# Patient Record
Sex: Male | Born: 1980
Health system: Southern US, Community
[De-identification: ages and names within clinical notes are randomized; demographics above are authoritative.]

## PROBLEM LIST (undated history)

## (undated) DIAGNOSIS — N63 Unspecified lump in unspecified breast: Secondary | ICD-10-CM

## (undated) DIAGNOSIS — E7800 Pure hypercholesterolemia, unspecified: Secondary | ICD-10-CM

## (undated) HISTORY — PX: ARTHROSCOPIC REPAIR ACL: SUR80

## (undated) HISTORY — PX: ANKLE FRACTURE SURGERY: SHX122

---

## 2004-03-14 ENCOUNTER — Ambulatory Visit: Payer: Self-pay | Admitting: Orthopaedic Surgery

## 2005-06-27 ENCOUNTER — Ambulatory Visit: Payer: Self-pay | Admitting: Cardiovascular Disease

## 2005-06-27 ENCOUNTER — Ambulatory Visit: Payer: Self-pay | Admitting: Family Medicine

## 2005-07-10 ENCOUNTER — Ambulatory Visit: Payer: Self-pay

## 2005-07-10 ENCOUNTER — Encounter: Payer: Self-pay | Admitting: Cardiology

## 2005-07-31 ENCOUNTER — Ambulatory Visit: Payer: Self-pay | Admitting: Family Medicine

## 2007-04-29 ENCOUNTER — Ambulatory Visit: Payer: Self-pay | Admitting: Gastroenterology

## 2007-05-13 ENCOUNTER — Ambulatory Visit: Payer: Self-pay | Admitting: Gastroenterology

## 2010-08-20 NOTE — Assessment & Plan Note (Signed)
[facility]                         GASTROENTEROLOGY OFFICE NOTE   Luis Sims, Luis Sims                        MRN:          [id number]  DATE:04/29/2007                            DOB:          Dec 04, 1980    REFERRING PHYSICIAN:  Derrek Gu, MD   REASON FOR CONSULT:  Hemorrhoids and rectal bleeding.   HISTORY OF PRESENT ILLNESS:  This is a 30 year old white male who  relates mild constipation and a 24 hour episode of nausea and vomiting  that occured several days ago.  With the constipation he noted a small  amount of bright red blood per rectum and then perianal swelling and  pain.  The bleeding stopped, but the pain and swelling worsened, and he  was seen by Dr. Derrek Gu, January 20.  Three thrombosed external  hemorrhoids were noted.  The largest one was incised.  The patient has  noted some improvement in symptoms but still has significant rectal  pain.  He has used hydrocodone with substantial improvement in pain but  significant drowsiness noted.  The patient has noted small external  hemorrhoids since 2006, but they have not caused any symptoms.  His  nausea, vomiting, and constipation were transient and have fully  resolved.  He has no ongoing gastrointestinal difficulties.  Family  history is negative for colon cancer, colon polyps, and inflammatory  bowel disease.   PAST MEDICAL HISTORY:  1. Hypertension, controlled with diet.  2. Status post left ACL repair by Dr. Rennis Chris January 2008.   CURRENT MEDICATIONS:  1. Hydrocodone 5/500 q.6h. p.r.n.  2. Tucks pads p.r.n.  3. Preparation H p.r.n.   MEDICATION ALLERGIES:  None known.   SOCIAL HISTORY:  He is single and engaged to be married.  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  He drinks a  modest amount of alcohol on social occasions and denies tobacco product  usage.  He has an Set designer and attended college at [employer].  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   REVIEW OF SYSTEMS:  Per the handwritten form.   PHYSICAL EXAM:  Well-developed, well-nourished white male in no acute  distress.  Height 6 feet 3 inches, weight 251.2 pounds, blood pressure 130/76,  pulse 60 and regular.  HEENT:  Anicteric sclerae.  Oropharynx is clear.  CHEST:  Clear to auscultation bilaterally.  CARDIAC:  Regular rate and rhythm without murmurs.  ABDOMEN:  Soft and nontender, nondistended, normoactive bowel sounds, no  palpable organomegaly, masses, or hernias.  RECTAL:  3 thrombosed external hemorrhoids which are all tender.  The  largest hemorrhoid has a healing incision site.  Digital examination  reveals anal canal tenderness, hemoccult positive stool in the vault,  and no other lesions.  NEUROLOGIC:  Alert and oriented x3.  Grossly nonfocal.   ASSESSMENT/PLAN:  Thrombosed external hemorrhoids with small volume  hematochezia.  Begin all standard rectal care instructions, begin Advil  400-600 mg q.8h. x4-5 days and then p.r.n.  Change to Darvocet-N 100, 1-  2 q.6h. p.r.n., begin AnaMantel 2.5% t.i.d.  Schedule flexible  sigmoidoscopy with possible destruction of internal hemorrhoids if  present.     Luis Lick. Russella Dar, MD, [facility]  Electronically Signed    MTS/MedQ  DD: 04/29/2007  DT: 04/29/2007  Job #: [id number]   cc:   Marye Round, MD

## 2011-08-09 ENCOUNTER — Emergency Department (HOSPITAL_COMMUNITY)
Admission: EM | Admit: 2011-08-09 | Discharge: 2011-08-09 | Disposition: A | Discharge status: Home / Self Care | Payer: BC Managed Care – PPO | Attending: Emergency Medicine | Admitting: Emergency Medicine

## 2011-08-09 ENCOUNTER — Encounter (HOSPITAL_COMMUNITY): Payer: Self-pay

## 2011-08-09 ENCOUNTER — Emergency Department (HOSPITAL_COMMUNITY): Payer: BC Managed Care – PPO

## 2011-08-09 DIAGNOSIS — S99919A Unspecified injury of unspecified ankle, initial encounter: Secondary | ICD-10-CM | POA: Insufficient documentation

## 2011-08-09 DIAGNOSIS — M25569 Pain in unspecified knee: Secondary | ICD-10-CM | POA: Insufficient documentation

## 2011-08-09 DIAGNOSIS — X500XXA Overexertion from strenuous movement or load, initial encounter: Secondary | ICD-10-CM | POA: Insufficient documentation

## 2011-08-09 DIAGNOSIS — S8992XA Unspecified injury of left lower leg, initial encounter: Secondary | ICD-10-CM

## 2011-08-09 DIAGNOSIS — S8990XA Unspecified injury of unspecified lower leg, initial encounter: Secondary | ICD-10-CM | POA: Insufficient documentation

## 2011-08-09 MED ORDER — HYDROCODONE-ACETAMINOPHEN 5-325 MG PO TABS: 1.0000 | ORAL_TABLET | Freq: Once | ORAL | Status: DC

## 2011-08-09 MED ORDER — HYDROCODONE-ACETAMINOPHEN 5-325 MG PO TABS: 1.0000 | ORAL_TABLET | ORAL | Status: AC | PRN

## 2011-08-09 MED ORDER — IBUPROFEN 800 MG PO TABS: 800.0000 mg | ORAL_TABLET | Freq: Three times a day (TID) | ORAL | Status: AC

## 2011-08-09 NOTE — Discharge Instructions (Signed)
CALL DR. SUPPLE ON Monday TO SCHEDULE A RECHECK APPOINTMENT FOR NEXT WEEK. ICE AND ELEVATE TO REDUCE SWELLING. WEAR KNEE IMMOBILIZER WHEN AMBULATING AND CRUTCHES TO BE WEIGHT BEARING AS TOLERATED. NORCO FOR PAIN, IBUPROFEN EVERY 8 HOURS FOR INFLAMMATION. RETURN HERE AS NEEDED.  Knee Immobilization You have been prescribed a knee immobilizer. This is used to support and protect an injured or painful knee. Knee immobilizers keep your knee from being used while it is healing. Some of the common immobilizers used include splints (air, plaster, fiberglass or aluminum) or casts. Wear your knee immobilizer as instructed and only remove it as instructed. If the immobilizer is used to protect broken bones, torn ligaments or torn cartilage, it may be 4 to 6 weeks before you are able to begin rehabilitating your knee, and it may be longer than that before you are able to return to athletic activity. HOME CARE INSTRUCTIONS   Use powder to control irritation from sweat and friction.   Adjust the immobilizer to be firm but not tight. Signs of an immobilizer that is too tight include:   Swelling.   Numbness.   Color change in your foot or ankle.   Cryotherapy Cryotherapy means treatment with cold. Ice or gel packs can be used to reduce both pain and swelling. Ice is the most helpful within the first 24 to 48 hours after an injury or flareup from overusing a muscle or joint. Sprains, strains, spasms, burning pain, shooting pain, and aches can all be eased with ice. Ice can also be used when recovering from surgery. Ice is effective, has very few side effects, and is safe for most people to use. PRECAUTIONS  Ice is not a safe treatment option for people with:  Raynaud's phenomenon. This is a condition affecting small blood vessels in the extremities. Exposure to cold may cause your problems to return.   Cold hypersensitivity. There are many forms of cold hypersensitivity, including:   Cold urticaria. Red,  itchy hives appear on the skin when the tissues begin to warm after being iced.   Cold erythema. This is a red, itchy rash caused by exposure to cold.   Cold hemoglobinuria. Red blood cells break down when the tissues begin to warm after being iced. The hemoglobin that carry oxygen are passed into the urine because they cannot combine with blood proteins fast enough.   Numbness or altered sensitivity in the area being iced.  If you have any of the following conditions, do not use ice until you have discussed cryotherapy with your caregiver:  Heart conditions, such as arrhythmia, angina, or chronic heart disease.   High blood pressure.   Healing wounds or open skin in the area being iced.   Current infections.   Rheumatoid arthritis.   Poor circulation.   Diabetes.  Ice slows the blood flow in the region it is applied. This is beneficial when trying to stop inflamed tissues from spreading irritating chemicals to surrounding tissues. However, if you expose your skin to cold temperatures for too long or without the proper protection, you can damage your skin or nerves. Watch for signs of skin damage due to cold. HOME CARE INSTRUCTIONS Follow these tips to use ice and cold packs safely.  Place a dry or damp towel between the ice and skin. A damp towel will cool the skin more quickly, so you may need to shorten the time that the ice is used.   For a more rapid response, add gentle compression to the  ice.   Ice for no more than 10 to 20 minutes at a time. The bonier the area you are icing, the less time it will take to get the benefits of ice.   Check your skin after 5 minutes to make sure there are no signs of a poor response to cold or skin damage.   Rest 20 minutes or more in between uses.   Once your skin is numb, you can end your treatment. You can test numbness by very lightly touching your skin. The touch should be so light that you do not see the skin dimple from the pressure of  your fingertip. When using ice, most people will feel these normal sensations in this order: cold, burning, aching, and numbness.   Do not use ice on someone who cannot communicate their responses to pain, such as small children or people with dementia.  HOW TO MAKE AN ICE PACK Ice packs are the most common way to use ice therapy. Other methods include ice massage, ice baths, and cryo-sprays. Muscle creams that cause a cold, tingly feeling do not offer the same benefits that ice offers and should not be used as a substitute unless recommended by your caregiver. To make an ice pack, do one of the following:  Place crushed ice or a bag of frozen vegetables in a sealable plastic bag. Squeeze out the excess air. Place this bag inside another plastic bag. Slide the bag into a pillowcase or place a damp towel between your skin and the bag.   Mix 3 parts water with 1 part rubbing alcohol. Freeze the mixture in a sealable plastic bag. When you remove the mixture from the freezer, it will be slushy. Squeeze out the excess air. Place this bag inside another plastic bag. Slide the bag into a pillowcase or place a damp towel between your skin and the bag.  SEEK MEDICAL CARE IF:  You develop white spots on your skin. This may give the skin a blotchy (mottled) appearance.   Your skin turns blue or pale.   Your skin becomes waxy or hard.   Your swelling gets worse.  MAKE SURE YOU:   Understand these instructions.   Will watch your condition.   Will get help right away if you are not doing well or get worse.  Document Released: 11/18/2010 Document Revised: 03/13/2011 Document Reviewed: 11/18/2010 [facility] Patient Information 2012 [place], [place].  Crutch Use You have been prescribed crutches to take weight off one of your lower legs or feet (extremities). When using crutches, make sure you are not putting pressure on the armpit (axilla). This could cause damage to the nerves that extend from your  axilla to the hand and arm. When fitted properly the crutches should be 2 to 3 finger widths below the axilla. Your weight should be supported by your hand, and not by resting upon the crutch with the axilla. When walking, first step with the crutches, then swing the healthy leg through and slightly ahead. When going up stairs, first step up with the healthy leg and then follow with the crutches and injured leg up to the same step, and so forth. If there is a handrail, hold both crutches in one hand, place your other hand on the handrail, and while placing your weight on your arms, lift your good leg to the step, then bring the crutches and the injured leg up to that step. Repeat for each step. When going down stairs, first step with the  injured leg and crutches, following down with the healthy leg to the same step. Be very careful, as going down stairs with crutches is very challenging. If you feel wobbly or nervous, sit down and inch yourself down the stairs on your butt. To get up from a chair, hold injured leg forward, grab armrest with one hand and the top of the crutches with the other hand. Using these supports, pull yourself up to a standing position. Reverse this procedure for sitting. See your caregiver for follow up as suggested. If you are discharged in an ace wrap and develop numbness, tingling, swelling, or increased pain, loosen the ace wrap and re-wrap looser. If these problems persist, see your caregiver as needed. If you have been instructed to use partial weight bearing, bear (apply) the amount of weight as suggested by your caregiver. Do not bear weight in an amount that causes pain on the area of injury. Document Released: 03/21/2000 Document Revised: 03/13/2011 Document Reviewed: 05/29/2008 [facility] Patient Information 2012 [place], [place].  Increased pain.   While resting, raise your leg above the level of your heart. This reduces throbbing and helps healing. Prop it up with  pillows.   Remove the immobilizer to bathe and sleep. Wear it other times until you see your doctor again.  When your splint or cast is removed:   Stretching and strengthening are important in caring for and preventing knee injuries. If your knee begins to get sore while you are conditioning, decrease or back off your activities until you no longer have discomfort. Then gradually resume your activities.   When strengthening your knee, increase your activities a little at a time so as not to develop injuries from over use.   Work out an exercise plan with your caregiver or physical therapist to get the best program for you.  SEEK IMMEDIATE MEDICAL CARE IF:   Your knee seems to be getting worse rather than better.   You have increasing pain or swelling in the knee, foot, or ankle.   You have problems caused by the knee immobilizer or it breaks or needs replacement.   You have increased swelling or redness or soreness (inflammation) in your knee.   Your leg becomes warm and more painful.   You develop an unexplained temperature over 102 F (38.9 C).  MAKE SURE YOU:   Understand these instructions.   Will watch your condition.   Will get help right away if you are not doing well or get worse.  Document Released: 03/24/2005 Document Revised: 03/13/2011 Document Reviewed: 09/09/2006 [facility] Patient Information 2012 [place], [place].

## 2011-08-09 NOTE — ED Notes (Signed)
Pt presents with no acuate distress- Pt reports playing ffotball and twisted left knee HX of left knee ACL repairs.  Pt ?out of place.  Pt is able to straight with some degree of pain

## 2011-08-09 NOTE — ED Provider Notes (Signed)
Medical screening examination/treatment/procedure(s) were performed by non-physician practitioner and as supervising physician I was immediately available for consultation/collaboration.  Doug Sou, MD 08/09/11 2324

## 2011-08-09 NOTE — ED Provider Notes (Signed)
History     CSN: [id number]  Arrival date & time 08/09/11  1500   First MD Initiated Contact with Patient 08/09/11 1521      Chief Complaint  Patient presents with  . Knee Injury  . Knee Pain    (Consider location/radiation/quality/duration/timing/severity/associated sxs/prior treatment) Patient is a 31 y.o. male presenting with knee pain. The history is provided by the patient.  Knee Pain This is a new problem. The current episode started today. Pertinent negatives include no numbness. Associated symptoms comments: Playing sports today when he twisted left knee while running, causing a pop. He ambulated off the field and has had progressive pain since that time. He has a history of ruptured ACL in the past x 2. No other complaint..    History reviewed. No pertinent past medical history.  Past Surgical History  Procedure Date  . Arthroscopic repair acl   . Ankle fracture surgery     No family history on file.  History  Substance Use Topics  . Smoking status: Never Smoker   . Smokeless tobacco: Not on file  . Alcohol Use: Yes      Review of Systems  Musculoskeletal: Negative.        See HPI  Skin: Negative.  Negative for wound.  Neurological: Negative.  Negative for numbness.    Allergies  Review of patient's allergies indicates no known allergies.  Home Medications  No current outpatient prescriptions on file.  BP 129/86  Pulse 99  Temp(Src) 98 F (36.7 C) (Oral)  Resp 18  Ht 6\' 3"  (1.905 m)  Wt 247 lb (112.038 kg)  BMI 30.87 kg/m2  SpO2 99%  Physical Exam  Constitutional: He appears well-developed and well-nourished.  Musculoskeletal:       Left knee mildly swollen, no bony deformity. Joint stable. Increased pain medially on MacMurray testing. Distal pulses present.  Skin: Skin is warm and dry. No erythema.    ED Course  Procedures (including critical care time)  Labs Reviewed - No data to display Dg Knee Complete 4 Views Left  08/09/2011   *RADIOLOGY REPORT*  Clinical Data: Pain after injury; history of ACL repair  LEFT KNEE - COMPLETE 4+ VIEW  Comparison: None.  Findings: There are postsurgical changes, consistent with prior ACL repair.  There is mild patellofemoral joint compartment narrowing and small suprapatellar effusion.  There is no evidence of fracture or dislocation, however.  IMPRESSION: There is no evidence of acute osseous injury.  A small suprapatellar effusion is present, however.  If there is clinical concern regarding a ligamentous or meniscal injury, MRI may be of help.  Original Report Authenticated By: Brandon Melnick, M.D.     No diagnosis found.  1. Left knee injury.  MDM  X-ray negative for bony injury, small effusion. Joint stable. He has orthopedic follow up outpatient, a knee immobilizer and crutches. Recommended weight bearing as tolerated.         Rodena Medin, PA-C 08/09/11 1623

## 2014-09-07 ENCOUNTER — Encounter: Payer: Self-pay | Admitting: Gastroenterology

## 2016-05-21 ENCOUNTER — Other Ambulatory Visit: Payer: Self-pay | Admitting: General Surgery

## 2016-05-21 DIAGNOSIS — N63 Unspecified lump in unspecified breast: Secondary | ICD-10-CM

## 2016-05-26 ENCOUNTER — Ambulatory Visit
Admission: RE | Admit: 2016-05-26 | Discharge: 2016-05-26 | Disposition: A | Discharge status: Home / Self Care | Payer: BLUE CROSS/BLUE SHIELD | Source: Ambulatory Visit | Attending: General Surgery | Admitting: General Surgery

## 2016-05-26 DIAGNOSIS — N63 Unspecified lump in unspecified breast: Secondary | ICD-10-CM

## 2016-05-26 HISTORY — DX: Unspecified lump in unspecified breast: N63.0

## 2017-03-20 ENCOUNTER — Telehealth (HOSPITAL_COMMUNITY): Payer: Self-pay | Admitting: Internal Medicine

## 2017-03-23 ENCOUNTER — Other Ambulatory Visit: Payer: Self-pay | Admitting: Internal Medicine

## 2017-03-23 DIAGNOSIS — R0789 Other chest pain: Secondary | ICD-10-CM

## 2017-03-25 NOTE — Telephone Encounter (Signed)
03/20/2017 11:17 AM Phone (Outgoing) Luis Sims, Brandon (Self) [phone] (H)   Left Message - Called pt and lmsg for him to CB to get scheduled for an echo.     By Trina AoGriffin, Sherrol Vicars A    03/23/2017 10:55 AM Phone (Outgoing) Luis Sims, Brandon (Self) [phone] (H)   Left Message - Called pt and lmsg for her to CB to get schedule for an echo.     By Elita BooneGriffin, Jlee Harkless A

## 2017-03-30 ENCOUNTER — Other Ambulatory Visit: Payer: Self-pay

## 2017-03-30 ENCOUNTER — Ambulatory Visit (HOSPITAL_COMMUNITY): Payer: BLUE CROSS/BLUE SHIELD | Attending: Cardiology

## 2017-03-30 ENCOUNTER — Encounter (INDEPENDENT_AMBULATORY_CARE_PROVIDER_SITE_OTHER): Payer: Self-pay

## 2017-03-30 DIAGNOSIS — E785 Hyperlipidemia, unspecified: Secondary | ICD-10-CM | POA: Diagnosis not present

## 2017-03-30 DIAGNOSIS — Z8249 Family history of ischemic heart disease and other diseases of the circulatory system: Secondary | ICD-10-CM | POA: Diagnosis not present

## 2017-03-30 DIAGNOSIS — R0789 Other chest pain: Secondary | ICD-10-CM

## 2017-04-16 ENCOUNTER — Other Ambulatory Visit: Payer: Self-pay

## 2017-04-16 ENCOUNTER — Encounter (HOSPITAL_COMMUNITY): Payer: Self-pay | Admitting: *Deleted

## 2017-04-16 DIAGNOSIS — Z79899 Other long term (current) drug therapy: Secondary | ICD-10-CM | POA: Diagnosis not present

## 2017-04-16 DIAGNOSIS — E7800 Pure hypercholesterolemia, unspecified: Secondary | ICD-10-CM | POA: Diagnosis not present

## 2017-04-16 DIAGNOSIS — K3521 Acute appendicitis with generalized peritonitis, with abscess: Principal | ICD-10-CM | POA: Insufficient documentation

## 2017-04-16 DIAGNOSIS — K3580 Unspecified acute appendicitis: Secondary | ICD-10-CM | POA: Diagnosis present

## 2017-04-16 NOTE — ED Triage Notes (Signed)
Pt c/o umbilical pain radiating into R lower abd since last night.

## 2017-04-17 ENCOUNTER — Observation Stay (HOSPITAL_COMMUNITY): Payer: BLUE CROSS/BLUE SHIELD | Admitting: Certified Registered Nurse Anesthetist

## 2017-04-17 ENCOUNTER — Other Ambulatory Visit: Payer: Self-pay

## 2017-04-17 ENCOUNTER — Encounter (HOSPITAL_COMMUNITY)
Admission: EM | Disposition: A | Discharge status: Home / Self Care | Payer: Self-pay | Source: Home / Self Care | Attending: Emergency Medicine

## 2017-04-17 ENCOUNTER — Emergency Department (HOSPITAL_COMMUNITY): Payer: BLUE CROSS/BLUE SHIELD

## 2017-04-17 ENCOUNTER — Encounter (HOSPITAL_COMMUNITY): Payer: Self-pay | Admitting: Certified Registered Nurse Anesthetist

## 2017-04-17 ENCOUNTER — Observation Stay (HOSPITAL_COMMUNITY)
Admission: EM | Admit: 2017-04-17 | Discharge: 2017-04-17 | Disposition: A | Discharge status: Home / Self Care | Payer: BLUE CROSS/BLUE SHIELD | Attending: General Surgery | Admitting: General Surgery

## 2017-04-17 DIAGNOSIS — K3580 Unspecified acute appendicitis: Secondary | ICD-10-CM | POA: Diagnosis present

## 2017-04-17 HISTORY — DX: Pure hypercholesterolemia, unspecified: E78.00

## 2017-04-17 HISTORY — PX: LAPAROSCOPIC APPENDECTOMY: SHX408

## 2017-04-17 LAB — COMPREHENSIVE METABOLIC PANEL
ALT: 29 U/L (ref 17–63)
AST: 29 U/L (ref 15–41)
Albumin: 4.3 g/dL (ref 3.5–5.0)
Alkaline Phosphatase: 47 U/L (ref 38–126)
Anion gap: 8 (ref 5–15)
BUN: 14 mg/dL (ref 6–20)
CO2: 24 mmol/L (ref 22–32)
Calcium: 9 mg/dL (ref 8.9–10.3)
Chloride: 105 mmol/L (ref 101–111)
Creatinine, Ser: 1.28 mg/dL — ABNORMAL HIGH (ref 0.61–1.24)
GFR calc Af Amer: >60 mL/min (ref >60)
GFR calc non Af Amer: >60 mL/min (ref >60)
Glucose, Bld: 99 mg/dL (ref 65–99)
Potassium: 3.7 mmol/L (ref 3.5–5.1)
Sodium: 137 mmol/L (ref 135–145)
Total Bilirubin: 1.2 mg/dL (ref 0.3–1.2)
Total Protein: 6.9 g/dL (ref 6.5–8.1)

## 2017-04-17 LAB — CBC
HCT: 43.2 % (ref 39.0–52.0)
Hemoglobin: 15.1 g/dL (ref 13.0–17.0)
MCH: 31.9 pg (ref 26.0–34.0)
MCHC: 35 g/dL (ref 30.0–36.0)
MCV: 91.1 fL (ref 78.0–100.0)
Platelets: 164 10*3/uL (ref 150–400)
RBC: 4.74 MIL/uL (ref 4.22–5.81)
RDW: 12.8 % (ref 11.5–15.5)
WBC: 9.5 10*3/uL (ref 4.0–10.5)

## 2017-04-17 LAB — URINALYSIS, ROUTINE W REFLEX MICROSCOPIC
Bacteria, UA: NONE SEEN
Bilirubin Urine: NEGATIVE
Glucose, UA: NEGATIVE mg/dL
Ketones, ur: NEGATIVE mg/dL
Leukocytes, UA: NEGATIVE
Nitrite: NEGATIVE
Protein, ur: NEGATIVE mg/dL
Specific Gravity, Urine: 1.02 (ref 1.005–1.030)
Squamous Epithelial / LPF: NONE SEEN
pH: 5 (ref 5.0–8.0)

## 2017-04-17 LAB — HIV ANTIBODY (ROUTINE TESTING W REFLEX): HIV Screen 4th Generation wRfx: NONREACTIVE

## 2017-04-17 LAB — LIPASE, BLOOD: Lipase: 40 U/L (ref 11–51)

## 2017-04-17 LAB — MRSA PCR SCREENING: MRSA by PCR: NEGATIVE

## 2017-04-17 SURGERY — APPENDECTOMY, LAPAROSCOPIC
Anesthesia: General | Site: Abdomen

## 2017-04-17 MED ORDER — OXYCODONE HCL 5 MG PO TABS: 5.0000 mg | ORAL_TABLET | Freq: Once | ORAL | Status: DC | PRN

## 2017-04-17 MED ORDER — PANTOPRAZOLE SODIUM 40 MG IV SOLR: 40.0000 mg | Freq: Every day | INTRAVENOUS | Status: DC

## 2017-04-17 MED ORDER — ONDANSETRON 4 MG PO TBDP: 4.0000 mg | ORAL_TABLET | Freq: Four times a day (QID) | ORAL | Status: DC | PRN

## 2017-04-17 MED ORDER — LACTATED RINGERS IV SOLN
INTRAVENOUS | Status: DC
  Administered 2017-04-17 (×3): via INTRAVENOUS

## 2017-04-17 MED ORDER — KETOROLAC TROMETHAMINE 30 MG/ML IJ SOLN
INTRAMUSCULAR | Status: DC | PRN
  Administered 2017-04-17: 30 mg via INTRAVENOUS

## 2017-04-17 MED ORDER — DEXTROSE 5 % IV SOLN
2.0000 g | Freq: Once | INTRAVENOUS | Status: AC
  Administered 2017-04-17: 2 g via INTRAVENOUS
  Filled 2017-04-17: qty 2

## 2017-04-17 MED ORDER — PROMETHAZINE HCL 25 MG/ML IJ SOLN
6.2500 mg | INTRAMUSCULAR | Status: DC | PRN
Start: 2017-04-17 — End: 2017-04-17

## 2017-04-17 MED ORDER — LIDOCAINE 2% (20 MG/ML) 5 ML SYRINGE
INTRAMUSCULAR | Status: AC
  Filled 2017-04-17: qty 5

## 2017-04-17 MED ORDER — FENTANYL CITRATE (PF) 250 MCG/5ML IJ SOLN
INTRAMUSCULAR | Status: AC
  Filled 2017-04-17: qty 5

## 2017-04-17 MED ORDER — 0.9 % SODIUM CHLORIDE (POUR BTL) OPTIME
TOPICAL | Status: DC | PRN
  Administered 2017-04-17: 1000 mL

## 2017-04-17 MED ORDER — HYDRALAZINE HCL 20 MG/ML IJ SOLN: 10.0000 mg | INTRAMUSCULAR | Status: DC | PRN

## 2017-04-17 MED ORDER — BUPIVACAINE-EPINEPHRINE 0.5% -1:200000 IJ SOLN
INTRAMUSCULAR | Status: DC | PRN
  Administered 2017-04-17: 9 mL

## 2017-04-17 MED ORDER — ONDANSETRON 4 MG PO TBDP: 4.0000 mg | ORAL_TABLET | Freq: Four times a day (QID) | ORAL | 0 refills | Status: DC | PRN

## 2017-04-17 MED ORDER — KETOROLAC TROMETHAMINE 30 MG/ML IJ SOLN
INTRAMUSCULAR | Status: AC
  Filled 2017-04-17: qty 1

## 2017-04-17 MED ORDER — MIDAZOLAM HCL 2 MG/2ML IJ SOLN
INTRAMUSCULAR | Status: AC
  Filled 2017-04-17: qty 2

## 2017-04-17 MED ORDER — IOPAMIDOL (ISOVUE-300) INJECTION 61%
INTRAVENOUS | Status: AC
  Administered 2017-04-17: 100 mL
  Filled 2017-04-17: qty 100

## 2017-04-17 MED ORDER — METRONIDAZOLE IN NACL 5-0.79 MG/ML-% IV SOLN
500.0000 mg | Freq: Once | INTRAVENOUS | Status: AC
  Administered 2017-04-17: 500 mg via INTRAVENOUS
  Filled 2017-04-17: qty 100

## 2017-04-17 MED ORDER — ACETAMINOPHEN 325 MG PO TABS: 650.0000 mg | ORAL_TABLET | Freq: Four times a day (QID) | ORAL | Status: DC | PRN

## 2017-04-17 MED ORDER — DEXAMETHASONE SODIUM PHOSPHATE 10 MG/ML IJ SOLN
INTRAMUSCULAR | Status: AC
  Filled 2017-04-17: qty 1

## 2017-04-17 MED ORDER — SUGAMMADEX SODIUM 200 MG/2ML IV SOLN
INTRAVENOUS | Status: AC
  Filled 2017-04-17: qty 2

## 2017-04-17 MED ORDER — PROPOFOL 10 MG/ML IV BOLUS
INTRAVENOUS | Status: AC
  Filled 2017-04-17: qty 20

## 2017-04-17 MED ORDER — ONDANSETRON HCL 4 MG/2ML IJ SOLN
INTRAMUSCULAR | Status: DC | PRN
  Administered 2017-04-17: 4 mg via INTRAVENOUS

## 2017-04-17 MED ORDER — ONDANSETRON HCL 4 MG/2ML IJ SOLN
INTRAMUSCULAR | Status: AC
  Filled 2017-04-17: qty 2

## 2017-04-17 MED ORDER — LIDOCAINE 2% (20 MG/ML) 5 ML SYRINGE
INTRAMUSCULAR | Status: DC | PRN
  Administered 2017-04-17: 100 mg via INTRAVENOUS

## 2017-04-17 MED ORDER — DEXAMETHASONE SODIUM PHOSPHATE 10 MG/ML IJ SOLN
INTRAMUSCULAR | Status: DC | PRN
  Administered 2017-04-17: 10 mg via INTRAVENOUS

## 2017-04-17 MED ORDER — ONDANSETRON HCL 4 MG/2ML IJ SOLN
4.0000 mg | Freq: Four times a day (QID) | INTRAMUSCULAR | Status: DC | PRN
  Administered 2017-04-17: 4 mg via INTRAVENOUS
  Filled 2017-04-17: qty 2

## 2017-04-17 MED ORDER — ROCURONIUM BROMIDE 10 MG/ML (PF) SYRINGE
PREFILLED_SYRINGE | INTRAVENOUS | Status: AC
  Filled 2017-04-17: qty 5

## 2017-04-17 MED ORDER — ROCURONIUM BROMIDE 50 MG/5ML IV SOSY
PREFILLED_SYRINGE | INTRAVENOUS | Status: DC | PRN
  Administered 2017-04-17: 50 mg via INTRAVENOUS

## 2017-04-17 MED ORDER — MORPHINE SULFATE (PF) 4 MG/ML IV SOLN
4.0000 mg | INTRAVENOUS | Status: DC | PRN
  Administered 2017-04-17: 4 mg via INTRAVENOUS
  Filled 2017-04-17: qty 1

## 2017-04-17 MED ORDER — FENTANYL CITRATE (PF) 100 MCG/2ML IJ SOLN
INTRAMUSCULAR | Status: DC | PRN
  Administered 2017-04-17 (×2): 50 ug via INTRAVENOUS
  Administered 2017-04-17 (×2): 25 ug via INTRAVENOUS
  Administered 2017-04-17: 100 ug via INTRAVENOUS

## 2017-04-17 MED ORDER — OXYCODONE HCL 5 MG PO TABS: 5.0000 mg | ORAL_TABLET | ORAL | 0 refills | Status: DC | PRN

## 2017-04-17 MED ORDER — PROPOFOL 10 MG/ML IV BOLUS
INTRAVENOUS | Status: DC | PRN
  Administered 2017-04-17: 200 mg via INTRAVENOUS

## 2017-04-17 MED ORDER — DIPHENHYDRAMINE HCL 25 MG PO CAPS: 25.0000 mg | ORAL_CAPSULE | Freq: Four times a day (QID) | ORAL | Status: DC | PRN

## 2017-04-17 MED ORDER — DIPHENHYDRAMINE HCL 50 MG/ML IJ SOLN: 25.0000 mg | Freq: Four times a day (QID) | INTRAMUSCULAR | Status: DC | PRN

## 2017-04-17 MED ORDER — OXYCODONE HCL 5 MG PO TABS
5.0000 mg | ORAL_TABLET | ORAL | Status: DC | PRN
  Administered 2017-04-17: 5 mg via ORAL
  Filled 2017-04-17: qty 1

## 2017-04-17 MED ORDER — OXYCODONE HCL 5 MG/5ML PO SOLN: 5.0000 mg | Freq: Once | ORAL | Status: DC | PRN

## 2017-04-17 MED ORDER — SODIUM CHLORIDE 0.9 % IR SOLN
Status: DC | PRN
  Administered 2017-04-17: 1000 mL

## 2017-04-17 MED ORDER — KCL IN DEXTROSE-NACL 20-5-0.45 MEQ/L-%-% IV SOLN
INTRAVENOUS | Status: DC
  Administered 2017-04-17: 06:00:00 via INTRAVENOUS
  Filled 2017-04-17: qty 1000

## 2017-04-17 MED ORDER — MIDAZOLAM HCL 2 MG/2ML IJ SOLN
INTRAMUSCULAR | Status: DC | PRN
  Administered 2017-04-17: 2 mg via INTRAVENOUS

## 2017-04-17 MED ORDER — HYDROMORPHONE HCL 1 MG/ML IJ SOLN: 0.2500 mg | INTRAMUSCULAR | Status: DC | PRN

## 2017-04-17 MED ORDER — SUGAMMADEX SODIUM 200 MG/2ML IV SOLN
INTRAVENOUS | Status: DC | PRN
  Administered 2017-04-17: 200 mg via INTRAVENOUS

## 2017-04-17 SURGICAL SUPPLY — 42 items
APPLIER CLIP ROT 10 11.4 M/L (STAPLE)
BENZOIN TINCTURE PRP APPL 2/3 (GAUZE/BANDAGES/DRESSINGS) ×2 IMPLANT
BLADE CLIPPER SURG (BLADE) IMPLANT
CANISTER SUCT 3000ML PPV (MISCELLANEOUS) ×2 IMPLANT
CHLORAPREP W/TINT 26ML (MISCELLANEOUS) ×2 IMPLANT
CLIP APPLIE ROT 10 11.4 M/L (STAPLE) IMPLANT
COVER SURGICAL LIGHT HANDLE (MISCELLANEOUS) ×2 IMPLANT
CUTTER FLEX LINEAR 45M (STAPLE) ×2 IMPLANT
DRSG TEGADERM 2-3/8X2-3/4 SM (GAUZE/BANDAGES/DRESSINGS) ×4 IMPLANT
DRSG TEGADERM 4X4.75 (GAUZE/BANDAGES/DRESSINGS) ×2 IMPLANT
ELECT REM PT RETURN 9FT ADLT (ELECTROSURGICAL) ×2
ELECTRODE REM PT RTRN 9FT ADLT (ELECTROSURGICAL) ×1 IMPLANT
ENDOLOOP SUT PDS II  0 18 (SUTURE)
ENDOLOOP SUT PDS II 0 18 (SUTURE) IMPLANT
FILTER SMOKE EVAC LAPAROSHD (FILTER) ×2 IMPLANT
GAUZE SPONGE 2X2 8PLY STRL LF (GAUZE/BANDAGES/DRESSINGS) ×1 IMPLANT
GLOVE BIO SURGEON STRL SZ7 (GLOVE) ×2 IMPLANT
GLOVE BIOGEL PI IND STRL 7.5 (GLOVE) ×1 IMPLANT
GLOVE BIOGEL PI INDICATOR 7.5 (GLOVE) ×1
GOWN STRL REUS W/ TWL LRG LVL3 (GOWN DISPOSABLE) ×3 IMPLANT
GOWN STRL REUS W/TWL LRG LVL3 (GOWN DISPOSABLE) ×3
KIT BASIN OR (CUSTOM PROCEDURE TRAY) ×2 IMPLANT
KIT ROOM TURNOVER OR (KITS) ×2 IMPLANT
NS IRRIG 1000ML POUR BTL (IV SOLUTION) ×2 IMPLANT
PAD ARMBOARD 7.5X6 YLW CONV (MISCELLANEOUS) ×4 IMPLANT
POUCH SPECIMEN RETRIEVAL 10MM (ENDOMECHANICALS) ×2 IMPLANT
RELOAD STAPLE TA45 3.5 REG BLU (ENDOMECHANICALS) ×2 IMPLANT
SCISSORS ENDO CVD 5DCS (MISCELLANEOUS) IMPLANT
SET IRRIG TUBING LAPAROSCOPIC (IRRIGATION / IRRIGATOR) ×2 IMPLANT
SHEARS HARMONIC ACE PLUS 36CM (ENDOMECHANICALS) ×2 IMPLANT
SLEEVE ENDOPATH XCEL 5M (ENDOMECHANICALS) ×2 IMPLANT
SPECIMEN JAR SMALL (MISCELLANEOUS) ×2 IMPLANT
SPONGE GAUZE 2X2 STER 10/PKG (GAUZE/BANDAGES/DRESSINGS) ×1
STRIP CLOSURE SKIN 1/2X4 (GAUZE/BANDAGES/DRESSINGS) ×2 IMPLANT
SUT MNCRL AB 4-0 PS2 18 (SUTURE) ×2 IMPLANT
TOWEL OR 17X24 6PK STRL BLUE (TOWEL DISPOSABLE) ×2 IMPLANT
TOWEL OR 17X26 10 PK STRL BLUE (TOWEL DISPOSABLE) ×2 IMPLANT
TRAY LAPAROSCOPIC MC (CUSTOM PROCEDURE TRAY) ×2 IMPLANT
TROCAR XCEL BLUNT TIP 100MML (ENDOMECHANICALS) ×2 IMPLANT
TROCAR XCEL NON-BLD 5MMX100MML (ENDOMECHANICALS) ×2 IMPLANT
TUBING INSUFFLATION (TUBING) ×2 IMPLANT
WATER STERILE IRR 1000ML POUR (IV SOLUTION) ×2 IMPLANT

## 2017-04-17 NOTE — Discharge Summary (Signed)
[facility] Discharge Summary   Patient ID: Luis BeltonJames Brandon Sims MRN: [id number] DOB/AGE: 37/05/1980 36 y.o.  Admit date: 04/17/2017 Discharge date: 04/17/2017  Admitting Diagnosis: Acute appendicitis  Discharge Diagnosis Patient Active Problem List   Diagnosis Date Noted  . Acute appendicitis 04/17/2017    Consultants None  Imaging: Ct Abdomen Pelvis W Contrast  Result Date: 04/17/2017 CLINICAL DATA:  Right lower quadrant pain EXAM: CT ABDOMEN AND PELVIS WITH CONTRAST TECHNIQUE: Multidetector CT imaging of the abdomen and pelvis was performed using the standard protocol following bolus administration of intravenous contrast. CONTRAST:  100mL ISOVUE-300 IOPAMIDOL (ISOVUE-300) INJECTION 61% COMPARISON:  None. FINDINGS: Lower chest: No pulmonary nodules or pleural effusion. No visible pericardial effusion. Hepatobiliary: Normal hepatic contours and density. No visible biliary dilatation. Normal gallbladder. Pancreas: Normal contours without ductal dilatation. No peripancreatic fluid collection. Spleen: Normal. Adrenals/Urinary Tract: --Adrenal glands: Normal. --Right kidney/ureter: No hydronephrosis or perinephric stranding. No nephrolithiasis. No obstructing ureteral stones. --Left kidney/ureter: No hydronephrosis or perinephric stranding. No nephrolithiasis. No obstructing ureteral stones. --Urinary bladder: Unremarkable. Stomach/Bowel: --Stomach/Duodenum: No hiatal hernia or other gastric abnormality. Normal duodenal course and caliber. --Small bowel: No dilatation or inflammation. --Colon: No focal abnormality. --Appendix: Location: Inferior to the cecum Diameter: 8 mm Appendicolith: No Mucosal hyper-enhancement: Yes Extraluminal gas: No Periappendiceal collection: No defined collection. Small amount of adjacent fluid. Vascular/Lymphatic: Normal course and caliber of the major abdominal vessels. No abdominal or pelvic lymphadenopathy. Reproductive: Normal prostate and seminal  vesicles. Musculoskeletal. No bony spinal canal stenosis or focal osseous abnormality. Other: None. IMPRESSION: Acute appendicitis without free intraperitoneal air or periappendiceal abscess. Electronically Signed   By: Deatra RobinsonKevin  Herman M.D.   On: 04/17/2017 02:25    Procedures Dr. Corliss Skainssuei (04/17/16) - Laparoscopic Appendectomy  Hospital Course:  Luis BeltonJames Brandon Sims is a 37yo male who [facility] with acute onset RLQ abdominal pain and nausea. Laboratory studies were unremarkable.  Workup showed acute appendicitis without complicating features.  Patient was admitted and underwent procedure listed above.  Tolerated procedure well and was transferred to the floor.  Diet was advanced as tolerated.  On POD0, the patient was voiding well, tolerating diet, ambulating well, pain well controlled, vital signs stable, incisions c/d/i and felt stable for discharge home.  Patient will follow up in our office in 2 weeks and knows to call with questions or concerns.    I have personally reviewed the patients medication history on the [place] controlled substance database.    Physical Exam: General: Alert, NAD Cardio: RRR Pulm: effort normal Abdomen: soft, ND, appropriately tender, multiple lap incisions with cdi dressings  Allergies as of 04/17/2017   No Known Allergies     Medication List    TAKE these medications   ezetimibe 10 MG tablet Commonly known as:  ZETIA Take 10 mg by mouth daily.   ondansetron 4 MG disintegrating tablet Commonly known as:  ZOFRAN-ODT Take 1 tablet (4 mg total) by mouth every 6 (six) hours as needed for nausea.   oxyCODONE 5 MG immediate release tablet Commonly known as:  Oxy IR/ROXICODONE Take 1 tablet (5 mg total) by mouth every 4 (four) hours as needed for severe pain.   rosuvastatin 20 MG tablet Commonly known as:  CRESTOR Take 20 mg by mouth daily.        Follow-up Information    [facility] [place].   Specialty:  General  Surgery Why:  Your appointment is 04/28/17 at 2pm. Please arrive 30 minutes prior  to your appointment to check in and fill out paperwork. Bring photo ID and insurance information. Contact information: [address] [place] [address] [phone]          Signed: Franne Forts, [facility] 04/17/2017, 2:51 PM Pager: [phone] Consults: [phone] Mon-Fri 7:00 am-4:30 pm Sat-Sun 7:00 am-11:30 am

## 2017-04-17 NOTE — Discharge Instructions (Signed)

## 2017-04-17 NOTE — Transfer of Care (Signed)
Immediate Anesthesia Transfer of Care Note  Patient: Luis BeltonJames Brandon Sims  Procedure(s) Performed: APPENDECTOMY LAPAROSCOPIC (N/A Abdomen)  Patient Location: PACU  Anesthesia Type:General  Level of Consciousness: awake, alert  and oriented  Airway & Oxygen Therapy: Patient Spontanous Breathing and Patient connected to face mask oxygen  Post-op Assessment: Report given to RN and Post -op Vital signs reviewed and stable  Post vital signs: Reviewed and stable  Last Vitals:  Vitals:   04/17/17 0800 04/17/17 0820  BP:  124/77  Pulse: 68 64  Resp: 13 16  Temp:  36.8 C  SpO2: 96% 100%    Last Pain:  Vitals:   04/17/17 0832  TempSrc:   PainSc: 5          Complications: No apparent anesthesia complications

## 2017-04-17 NOTE — Anesthesia Postprocedure Evaluation (Signed)
Anesthesia Post Note  Patient: Luis BeltonJames Brandon Sims  Procedure(s) Performed: APPENDECTOMY LAPAROSCOPIC (N/A Abdomen)     Patient location during evaluation: PACU Anesthesia Type: General Level of consciousness: awake and alert Pain management: pain level controlled Vital Signs Assessment: post-procedure vital signs reviewed and stable Respiratory status: spontaneous breathing, nonlabored ventilation and respiratory function stable Cardiovascular status: blood pressure returned to baseline and stable Postop Assessment: no apparent nausea or vomiting Anesthetic complications: no    Last Vitals:  Vitals:   04/17/17 1155 04/17/17 1157  BP:  108/74  Pulse:  67  Resp:  15  Temp: 36.7 C   SpO2:  100%    Last Pain:  Vitals:   04/17/17 1219  TempSrc:   PainSc: 6                  Lowella CurbWarren Ray Zyere Jiminez

## 2017-04-17 NOTE — Progress Notes (Addendum)
Discharge instructions reviewed with Patient, Pt had no questions.  Prescriptions given to Pt along with his excuse from work. Pt had no questions. Pt to be escorted to front lobby with staff, wife to pick up Pt. Pt had no questions at discharge

## 2017-04-17 NOTE — Anesthesia Procedure Notes (Signed)
Procedure Name: Intubation Date/Time: 04/17/2017 10:28 AM Performed by: Pearson Grippeobertson, Abdallah Hern M, CRNA Pre-anesthesia Checklist: Patient identified, Emergency Drugs available, Suction available and Patient being monitored Patient Re-evaluated:Patient Re-evaluated prior to induction Oxygen Delivery Method: Circle system utilized Preoxygenation: Pre-oxygenation with 100% oxygen Induction Type: IV induction Ventilation: Mask ventilation without difficulty Laryngoscope Size: Miller and 2 Grade View: Grade I Tube type: Oral Tube size: 7.5 mm Number of attempts: 1 Airway Equipment and Method: Stylet and Oral airway Placement Confirmation: ETT inserted through vocal cords under direct vision,  positive ETCO2 and breath sounds checked- equal and bilateral Secured at: 23 cm Tube secured with: Tape Dental Injury: Teeth and Oropharynx as per pre-operative assessment

## 2017-04-17 NOTE — Anesthesia Preprocedure Evaluation (Signed)
Anesthesia Evaluation  Patient identified by MRN, date of birth, ID band Patient awake    Reviewed: Allergy & Precautions, NPO status , Patient's Chart, lab work & pertinent test results  Airway Mallampati: II  TM Distance: >3 FB Neck ROM: Full    Dental no notable dental hx.    Pulmonary neg pulmonary ROS,    Pulmonary exam normal breath sounds clear to auscultation       Cardiovascular negative cardio ROS Normal cardiovascular exam Rhythm:Regular Rate:Normal     Neuro/Psych negative neurological ROS  negative psych ROS   GI/Hepatic negative GI ROS, Neg liver ROS,   Endo/Other  negative endocrine ROS  Renal/GU negative Renal ROS  negative genitourinary   Musculoskeletal negative musculoskeletal ROS (+)   Abdominal   Peds negative pediatric ROS (+)  Hematology negative hematology ROS (+)   Anesthesia Other Findings Acute appendicitis  Reproductive/Obstetrics negative OB ROS                             Anesthesia Physical Anesthesia Plan  ASA: II  Anesthesia Plan: General   Post-op Pain Management:    Induction: Intravenous  PONV Risk Score and Plan: 2 and Ondansetron and Dexamethasone  Airway Management Planned: Oral ETT  Additional Equipment:   Intra-op Plan:   Post-operative Plan: Extubation in OR  Informed Consent: I have reviewed the patients History and Physical, chart, labs and discussed the procedure including the risks, benefits and alternatives for the proposed anesthesia with the patient or authorized representative who has indicated his/her understanding and acceptance.   Dental advisory given  Plan Discussed with: CRNA  Anesthesia Plan Comments:         Anesthesia Quick Evaluation

## 2017-04-17 NOTE — H&P (Signed)
Luis Sims is an 37 y.o. male.   Chief Complaint: RLQ abdominal pain HPI: Rece developed abdominal pain over his umbilicus around 8 PM yesterday.  The pain persisted and gradually moved to his right lower quadrant.  He had associated nausea.  He came to the emergency room for evaluation.  Laboratory studies were unremarkable.  CT scan of the abdomen and pelvis was performed demonstrating appendicitis without complicating features.  I was asked to see him for admission and management.  Past Medical History:  Diagnosis Date  . Breast mass   . Hypercholesteremia     Past Surgical History:  Procedure Laterality Date  . ANKLE FRACTURE SURGERY    . ARTHROSCOPIC REPAIR ACL      No family history on file. Social History:  reports that  has never smoked. he has never used smokeless tobacco. He reports that he drinks alcohol. He reports that he does not use drugs.  Allergies: No Known Allergies   (Not in a hospital admission)  Results for orders placed or performed during the hospital encounter of 04/17/17 (from the past 48 hour(s))  Lipase, blood     Status: None   Collection Time: 04/17/17 12:00 AM  Result Value Ref Range   Lipase 40 11 - 51 U/L  Comprehensive metabolic panel     Status: Abnormal   Collection Time: 04/17/17 12:00 AM  Result Value Ref Range   Sodium 137 135 - 145 mmol/L   Potassium 3.7 3.5 - 5.1 mmol/L   Chloride 105 101 - 111 mmol/L   CO2 24 22 - 32 mmol/L   Glucose, Bld 99 65 - 99 mg/dL   BUN 14 6 - 20 mg/dL   Creatinine, Ser 1.28 (H) 0.61 - 1.24 mg/dL   Calcium 9.0 8.9 - 10.3 mg/dL   Total Protein 6.9 6.5 - 8.1 g/dL   Albumin 4.3 3.5 - 5.0 g/dL   AST 29 15 - 41 U/L   ALT 29 17 - 63 U/L   Alkaline Phosphatase 47 38 - 126 U/L   Total Bilirubin 1.2 0.3 - 1.2 mg/dL   GFR calc non Af Amer >60 >60 mL/min   GFR calc Af Amer >60 >60 mL/min    Comment: (NOTE) The eGFR has been calculated using the CKD EPI equation. This calculation has not been validated  in all clinical situations. eGFR's persistently <60 mL/min signify possible Chronic Kidney Disease.    Anion gap 8 5 - 15  CBC     Status: None   Collection Time: 04/17/17 12:00 AM  Result Value Ref Range   WBC 9.5 4.0 - 10.5 K/uL   RBC 4.74 4.22 - 5.81 MIL/uL   Hemoglobin 15.1 13.0 - 17.0 g/dL   HCT 43.2 39.0 - 52.0 %   MCV 91.1 78.0 - 100.0 fL   MCH 31.9 26.0 - 34.0 pg   MCHC 35.0 30.0 - 36.0 g/dL   RDW 12.8 11.5 - 15.5 %   Platelets 164 150 - 400 K/uL  Urinalysis, Routine w reflex microscopic     Status: Abnormal   Collection Time: 04/17/17 12:06 AM  Result Value Ref Range   Color, Urine YELLOW YELLOW   APPearance CLEAR CLEAR   Specific Gravity, Urine 1.020 1.005 - 1.030   pH 5.0 5.0 - 8.0   Glucose, UA NEGATIVE NEGATIVE mg/dL   Hgb urine dipstick SMALL (A) NEGATIVE   Bilirubin Urine NEGATIVE NEGATIVE   Ketones, ur NEGATIVE NEGATIVE mg/dL   Protein, ur NEGATIVE NEGATIVE  mg/dL   Nitrite NEGATIVE NEGATIVE   Leukocytes, UA NEGATIVE NEGATIVE   RBC / HPF 0-5 0 - 5 RBC/hpf   WBC, UA 0-5 0 - 5 WBC/hpf   Bacteria, UA NONE SEEN NONE SEEN   Squamous Epithelial / LPF NONE SEEN NONE SEEN   Mucus PRESENT    Ct Abdomen Pelvis W Contrast  Result Date: 04/17/2017 CLINICAL DATA:  Right lower quadrant pain EXAM: CT ABDOMEN AND PELVIS WITH CONTRAST TECHNIQUE: Multidetector CT imaging of the abdomen and pelvis was performed using the standard protocol following bolus administration of intravenous contrast. CONTRAST:  136m ISOVUE-300 IOPAMIDOL (ISOVUE-300) INJECTION 61% COMPARISON:  None. FINDINGS: Lower chest: No pulmonary nodules or pleural effusion. No visible pericardial effusion. Hepatobiliary: Normal hepatic contours and density. No visible biliary dilatation. Normal gallbladder. Pancreas: Normal contours without ductal dilatation. No peripancreatic fluid collection. Spleen: Normal. Adrenals/Urinary Tract: --Adrenal glands: Normal. --Right kidney/ureter: No hydronephrosis or perinephric  stranding. No nephrolithiasis. No obstructing ureteral stones. --Left kidney/ureter: No hydronephrosis or perinephric stranding. No nephrolithiasis. No obstructing ureteral stones. --Urinary bladder: Unremarkable. Stomach/Bowel: --Stomach/Duodenum: No hiatal hernia or other gastric abnormality. Normal duodenal course and caliber. --Small bowel: No dilatation or inflammation. --Colon: No focal abnormality. --Appendix: Location: Inferior to the cecum Diameter: 8 mm Appendicolith: No Mucosal hyper-enhancement: Yes Extraluminal gas: No Periappendiceal collection: No defined collection. Small amount of adjacent fluid. Vascular/Lymphatic: Normal course and caliber of the major abdominal vessels. No abdominal or pelvic lymphadenopathy. Reproductive: Normal prostate and seminal vesicles. Musculoskeletal. No bony spinal canal stenosis or focal osseous abnormality. Other: None. IMPRESSION: Acute appendicitis without free intraperitoneal air or periappendiceal abscess. Electronically Signed   By: KUlyses JarredM.D.   On: 04/17/2017 02:25    Review of Systems  Constitutional: Negative for chills and fever.  HENT: Negative.   Eyes: Negative for blurred vision and double vision.  Respiratory: Negative for cough and shortness of breath.   Cardiovascular: Negative for chest pain and leg swelling.  Gastrointestinal: Positive for abdominal pain and nausea. Negative for constipation and vomiting.  Musculoskeletal: Negative.   Skin: Negative.   Neurological: Negative.   Endo/Heme/Allergies: Negative.   Psychiatric/Behavioral: Negative.     Blood pressure 125/71, pulse 76, temperature 98.5 F (36.9 C), temperature source Oral, resp. rate (!) 21, SpO2 97 %. Physical Exam  Constitutional: He is oriented to person, place, and time. He appears well-developed and well-nourished. No distress.  HENT:  Head: Normocephalic.  Right Ear: External ear normal.  Left Ear: External ear normal.  Nose: Nose normal.   Mouth/Throat: Oropharynx is clear and moist.  Eyes: EOM are normal. Pupils are equal, round, and reactive to light.  Neck: Neck supple. No tracheal deviation present. No thyromegaly present.  Cardiovascular: Normal rate, regular rhythm, normal heart sounds and intact distal pulses.  Respiratory: Effort normal and breath sounds normal. No respiratory distress. He has no wheezes. He has no rales.  GI: Soft. He exhibits no distension. There is tenderness. There is guarding. There is no rebound.  Tender right lower quadrant with voluntary guarding, no generalized tenderness  Musculoskeletal: Normal range of motion.  Neurological: He is alert and oriented to person, place, and time.  Skin: Skin is warm and dry.  Psychiatric: He has a normal mood and affect.     Assessment/Plan Acute appendicitis -Rocephin and Flagyl IV, plan laparoscopic appendectomy later this morning.  I discussed the procedure, risks, and benefits with him.  I will discuss this with Dr. TGeorgette Doveras well.  BZenovia Jarred MD  04/17/2017, 3:34 AM

## 2017-04-17 NOTE — Op Note (Signed)
Appendectomy, Lap, Procedure Note  Indications: The patient presented with a history of right-sided abdominal pain. A CT scan revealed findings consistent with acute appendicitis.  Pre-operative Diagnosis: Acute appendicitis with generalized peritonitis  Post-operative Diagnosis: Same  Surgeon: Wynona LunaMatthew K Ezmeralda Stefanick   Assistants: none  Anesthesia: General endotracheal anesthesia  ASA Class: 1E  Procedure Details  The patient was seen again in the Holding Room. The risks, benefits, complications, treatment options, and expected outcomes were discussed with the patient and/or family. The possibilities of reaction to medication, perforation of viscus, bleeding, recurrent infection, finding a normal appendix, the need for additional procedures, failure to diagnose a condition, and creating a complication requiring transfusion or operation were discussed. There was concurrence with the proposed plan and informed consent was obtained. The site of surgery was properly noted. The patient was taken to Operating Room, identified as Luis Sims and the procedure verified as Appendectomy. A Time Out was held and the above information confirmed.  The patient was placed in the supine position and general anesthesia was induced.  The abdomen was prepped and draped in a sterile fashion. A one centimeter infraumbilical incision was made.  Dissection was carried down to the fascia bluntly.  The fascia was incised vertically.  We entered the peritoneal cavity bluntly.  A pursestring suture was passed around the incision with a 0 Vicryl.  The Hasson cannula was introduced into the abdomen and the tails of the suture were used to hold the Hasson in place.   The pneumoperitoneum was then established maintaining a maximum pressure of 15 mmHg.  Additional 5 mm cannulas then placed in the left lower quadrant of the abdomen and the right upper quadrant under direct visualization. A careful evaluation of the entire  abdomen was carried out. The liver and gallbladder appeared normal.  The patient was placed in Trendelenburg and left lateral decubitus position.  The scope was moved to the right upper quadrant port site.  The appendix was adherent to the anterior abdominal wall in the right pelvis.  The distal half of the appendix is quite edematous with some fibrinous exudate.  There is no sign of abscess or perforation.  The appendix was carefully dissected. The appendix was skeletonized with the harmonic scalpel.   The appendix was divided at its base using an endo-GIA stapler. Minimal appendiceal stump was left in place. There was no evidence of bleeding, leakage, or complication after division of the appendix. Irrigation was also performed and irrigate suctioned from the abdomen as well.  The umbilical port site was closed with the purse string suture. There was no residual palpable fascial defect.  The trocar site skin wounds were closed with 4-0 Monocryl.  Instrument, sponge, and needle counts were correct at the conclusion of the case.   Findings: The appendix was found to be inflamed. There were not signs of necrosis.  There was not perforation. There was not abscess formation.  Estimated Blood Loss:  Minimal         Drains: none         Specimens: appendix         Complications:  None; patient tolerated the procedure well.         Disposition: PACU - hemodynamically stable.         Condition: stable  Wilmon ArmsMatthew K. Corliss Skainssuei, MD, [facility]  General/ Trauma Surgery  04/17/2017 11:11 AM

## 2017-04-17 NOTE — ED Notes (Signed)
Informed consent signed at bedside. Patient denies any questions regarding surgery at this time.

## 2017-04-17 NOTE — ED Notes (Signed)
Attempted to call report x 1  

## 2017-04-17 NOTE — ED Provider Notes (Signed)
[facility] EMERGENCY DEPARTMENT Provider Note   CSN: [id number] Arrival date & time: 04/16/17  2345     History   Chief Complaint Chief Complaint  Patient presents with  . Abdominal Pain    HPI Luis Sims is a 37 y.o. male.  37 year old male presents to the emergency department for evaluation of abdominal pain.  He notes periumbilical pain which began yesterday.  Pain was initially mild, but has continued to worsen.  It has been constant and has migrated to his right lower abdomen.  He states that his pain is worse when supine.  It feels slightly improved when seated.  He has not taken any medications for his symptoms and denies fever, nausea, vomiting, dysuria.  No history of abdominal surgeries.  No chronic medical problems.   The history is provided by the patient. No language interpreter was used.  Abdominal Pain      Past Medical History:  Diagnosis Date  . Breast mass   . Hypercholesteremia     There are no active problems to display for this patient.   Past Surgical History:  Procedure Laterality Date  . ANKLE FRACTURE SURGERY    . ARTHROSCOPIC REPAIR ACL         Home Medications    Prior to Admission medications   Not on File    Family History No family history on file.  Social History Social History   Tobacco Use  . Smoking status: Never Smoker  . Smokeless tobacco: Never Used  Substance Use Topics  . Alcohol use: Yes  . Drug use: No     Allergies   Patient has no known allergies.   Review of Systems Review of Systems  Gastrointestinal: Positive for abdominal pain.  Ten systems reviewed and are negative for acute change, except as noted in the HPI.    Physical Exam Updated Vital Signs BP (!) 168/97   Pulse 81   Temp 98.5 F (36.9 C) (Oral)   Resp 16   SpO2 100%   Physical Exam  Constitutional: He is oriented to person, place, and time. He appears well-developed and well-nourished. No distress.    Nontoxic appearing and in NAD  HENT:  Head: Normocephalic and atraumatic.  Eyes: Conjunctivae and EOM are normal. No scleral icterus.  Neck: Normal range of motion.  Cardiovascular: Normal rate, regular rhythm and intact distal pulses.  Pulmonary/Chest: Effort normal. No stridor. No respiratory distress.  Respirations even and unlabored  Abdominal: Soft. He exhibits no distension and no mass. There is tenderness. There is guarding (voluntary).  Focal tenderness in the right lower quadrant at McBurney's point with voluntary guarding.  Abdomen otherwise soft and nontender.  No peritoneal signs.  Musculoskeletal: Normal range of motion.  Neurological: He is alert and oriented to person, place, and time. He exhibits normal muscle tone. Coordination normal.  Ambulatory with steady gait.  Skin: Skin is warm and dry. No rash noted. He is not diaphoretic. No erythema. No pallor.  Psychiatric: He has a normal mood and affect. His behavior is normal.  Nursing note and vitals reviewed.    ED Treatments / Results  Labs (all labs ordered are listed, but only abnormal results are displayed) Labs Reviewed  COMPREHENSIVE METABOLIC PANEL - Abnormal; Notable for the following components:      Result Value   Creatinine, Ser 1.28 (*)    All other components within normal limits  URINALYSIS, ROUTINE W REFLEX MICROSCOPIC - Abnormal; Notable for the following  components:   Hgb urine dipstick SMALL (*)    All other components within normal limits  LIPASE, BLOOD  CBC    EKG  EKG Interpretation None       Radiology Ct Abdomen Pelvis W Contrast  Result Date: 04/17/2017 CLINICAL DATA:  Right lower quadrant pain EXAM: CT ABDOMEN AND PELVIS WITH CONTRAST TECHNIQUE: Multidetector CT imaging of the abdomen and pelvis was performed using the standard protocol following bolus administration of intravenous contrast. CONTRAST:  100mL ISOVUE-300 IOPAMIDOL (ISOVUE-300) INJECTION 61% COMPARISON:  None.  FINDINGS: Lower chest: No pulmonary nodules or pleural effusion. No visible pericardial effusion. Hepatobiliary: Normal hepatic contours and density. No visible biliary dilatation. Normal gallbladder. Pancreas: Normal contours without ductal dilatation. No peripancreatic fluid collection. Spleen: Normal. Adrenals/Urinary Tract: --Adrenal glands: Normal. --Right kidney/ureter: No hydronephrosis or perinephric stranding. No nephrolithiasis. No obstructing ureteral stones. --Left kidney/ureter: No hydronephrosis or perinephric stranding. No nephrolithiasis. No obstructing ureteral stones. --Urinary bladder: Unremarkable. Stomach/Bowel: --Stomach/Duodenum: No hiatal hernia or other gastric abnormality. Normal duodenal course and caliber. --Small bowel: No dilatation or inflammation. --Colon: No focal abnormality. --Appendix: Location: Inferior to the cecum Diameter: 8 mm Appendicolith: No Mucosal hyper-enhancement: Yes Extraluminal gas: No Periappendiceal collection: No defined collection. Small amount of adjacent fluid. Vascular/Lymphatic: Normal course and caliber of the major abdominal vessels. No abdominal or pelvic lymphadenopathy. Reproductive: Normal prostate and seminal vesicles. Musculoskeletal. No bony spinal canal stenosis or focal osseous abnormality. Other: None. IMPRESSION: Acute appendicitis without free intraperitoneal air or periappendiceal abscess. Electronically Signed   By: Deatra RobinsonKevin  Herman M.D.   On: 04/17/2017 02:25    Procedures Procedures (including critical care time)  Medications Ordered in ED Medications  cefTRIAXone (ROCEPHIN) 2 g in dextrose 5 % 50 mL IVPB (not administered)    And  metroNIDAZOLE (FLAGYL) IVPB 500 mg (not administered)  iopamidol (ISOVUE-300) 61 % injection (100 mLs  Contrast Given 04/17/17 0139)     Initial Impression / Assessment and Plan / ED Course  I have reviewed the triage vital signs and the nursing notes.  Pertinent labs & imaging results that were  available during my care of the patient were reviewed by me and considered in my medical decision making (see chart for details).     12:39 AM Patient evaluated for onset of periumbilical abdominal pain radiating to the right lower quadrant.  Symptoms began yesterday.  He denies any nausea or vomiting, fever.  He is noted to have focal right lower quadrant tenderness concerning for appendicitis.  CT scan will be ordered as well as labs.  The patient transferred back to the waiting room with workup pending until bed available.  He declines pain medicine at this time.  2:30 AM CT positive for appendicitis.  Antibiotics ordered.  We will consult surgery.  2:32 AM Case discussed with Dr. Janee Mornhompson of [facility] who will evaluate.   Final Clinical Impressions(s) / ED Diagnoses   Final diagnoses:  Acute appendicitis, unspecified acute appendicitis type    ED Discharge Orders    None       Antony MaduraHumes, Louvinia Cumbo, PA-C 04/17/17 0232    Azalia Bilisampos, Kevin, MD 04/18/17 [phone]

## 2017-04-17 NOTE — ED Notes (Signed)
Pt aware of PRN pain medicine orders at this time. Pt refused morphine at this time.

## 2017-04-18 ENCOUNTER — Encounter (HOSPITAL_COMMUNITY): Payer: Self-pay | Admitting: Surgery

## 2017-05-01 ENCOUNTER — Encounter: Payer: Self-pay | Admitting: *Deleted

## 2017-05-10 NOTE — Progress Notes (Signed)
Cardiology Office Note   Date:  05/13/2017   ID:  Luis Sims, DOB 07/27/1980, MRN [id number]  PCP:  Rodrigo Ran, MD  Cardiologist:   No primary care provider on file. Referring:  Rodrigo Ran, MD  Chief Complaint  Patient presents with  . Abnormal ECG      History of Present Illness: Luis Sims is a 37 y.o. male who is referred by Rodrigo Ran, MD for evaluation of an abnormal EKG.  He has no prior cardiac history other than some chest pain he thinks over 10 years ago when he did not have any specific diagnosis at that time.  He has a very strong family history of early coronary artery disease as described below.  He does have an EKG with some increased voltages and ST segment changes that most likely represents repolarization.  He had an echocardiogram in April to evaluate this and there was some mild focal basal hypertrophy of the septum.  The EF was 55-60%.  There was otherwise normal function.  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  He played college football.  He has had some knee injuries and so he does not run but he does not elliptical in a bike.  He works out very vigorously with [employer].  With all of this he denies any chest pressure, neck or arm discomfort.  He is never had any palpitations, presyncope or syncope.  He is had no PND or orthopnea.   Past Medical History:  Diagnosis Date  . Breast mass   . Hypercholesteremia     Past Surgical History:  Procedure Laterality Date  . ANKLE FRACTURE SURGERY    . ARTHROSCOPIC REPAIR ACL    . LAPAROSCOPIC APPENDECTOMY N/A 04/17/2017   Procedure: APPENDECTOMY LAPAROSCOPIC;  Surgeon: Manus Rudd, MD;  Location: [facility] OR;  Service: General;  Laterality: N/A;     Current Outpatient Medications  Medication Sig Dispense Refill  . ezetimibe (ZETIA) 10 MG tablet Take 10 mg by mouth daily.  1  . rosuvastatin (CRESTOR)  20 MG tablet Take 20 mg by mouth daily.     No current facility-administered medications for this visit.     Allergies:   Patient has no known allergies.    Social History:  The patient  reports that  has never smoked. he has never used smokeless tobacco. He reports that he drinks alcohol. He reports that he does not use drugs.   Family History:  The patient's family history includes Heart attack in his father; Heart disease in his maternal grandfather and paternal grandfather.    ROS:  Please see the history of present illness.   Otherwise, review of systems are positive for none.   All other systems are reviewed and negative.    PHYSICAL EXAM: VS:  BP 122/72 (BP Location: Right Arm, Patient Position: Sitting, Cuff Size: Large)   Pulse 94   Ht 6\' 3"  (1.905 m)   Wt 251 lb 6.4 oz (114 kg)   BMI 31.42 kg/m  , BMI Body mass index is 31.42 kg/m. GENERAL:  Well appearing HEENT:  Pupils equal round and reactive, fundi not visualized, oral mucosa unremarkable NECK:  No jugular venous distention, waveform within normal limits, carotid upstroke brisk and symmetric, no bruits, no thyromegaly LYMPHATICS:  No cervical, inguinal adenopathy LUNGS:  Clear to auscultation bilaterally BACK:  No CVA tenderness CHEST:  Unremarkable HEART:  PMI not displaced or sustained,S1 and S2 within normal limits, no S3, no S4, no clicks, no rubs, no murmurs ABD:  Flat, positive bowel sounds normal in frequency in pitch, no bruits, no rebound, no guarding, no midline pulsatile mass, no hepatomegaly, no splenomegaly EXT:  2 plus pulses throughout, no edema, no cyanosis no clubbing SKIN:  No rashes no nodules NEURO:  Cranial nerves II through XII grossly intact, motor grossly intact throughout PSYCH:  Cognitively intact, oriented to person place and time    EKG:  EKG is ordered today. The ekg ordered today demonstrates sinus rhythm, rate 73, axis within normal limits, intervals within normal limits, early  repolarization pattern.   Recent Labs: 04/17/2017: ALT 29; BUN 14; Creatinine, Ser 1.28; Hemoglobin 15.1; Platelets 164; Potassium 3.7; Sodium 137    Lipid Panel No results found for: CHOL, TRIG, HDL, CHOLHDL, VLDL, LDLCALC, LDLDIRECT    Wt Readings from Last 3 Encounters:  05/12/17 251 lb 6.4 oz (114 kg)  04/17/17 247 lb (112 kg)  08/09/11 247 lb (112 kg)      Other studies Reviewed: Additional studies/ records that were reviewed today include: Echo, labs. Review of the above records demonstrates:  Please see elsewhere in the note.     ASSESSMENT AND PLAN:  FAMILY HISTORY OF SUDDEN DEATH: He reports that his father's death was related to an acute coronary event.  I would like him to get autopsy records which he thinks are available.  I would suspect that this was early coronary artery disease.  There was a slight abnormality on the echo with some septal hypertrophy that is probably a variant of normal but I will review this with my colleagues and would like to know more about the family history.  In the meantime we will assume that this was obstructive coronary disease and that he has that family history.  Given this and some slight shortness of breath with exertion and a slight abnormality on EKG a coronary calcium score is indicated.  This would be helpful for its negative predictive value.  We talked at length about primary risk reduction.  DYSLIPIDEMIA: He has slightly abnormal lipid profile and is is being aggressively and appropriately managed by Rodrigo RanPerini, Mark, MD .  He and I talked specifically about a prescription for exercise and about appropriate diet which I would suggest would be the Mediterranean lifestyle   Current medicines are reviewed at length with the patient today.  The patient does not have concerns regarding medicines.  The following changes have been made:  no change  Labs/ tests ordered today include:   Orders Placed This Encounter  Procedures  . CT  CARDIAC SCORING  . EKG 12-Lead     Disposition:   FU with me based on the results of the above.     Signed, Rollene RotundaJames Izac Faulkenberry, MD  05/13/2017 8:54 PM    [facility]

## 2017-05-12 ENCOUNTER — Ambulatory Visit: Payer: BLUE CROSS/BLUE SHIELD | Admitting: Cardiology

## 2017-05-12 ENCOUNTER — Encounter: Payer: Self-pay | Admitting: Cardiology

## 2017-05-12 ENCOUNTER — Encounter (INDEPENDENT_AMBULATORY_CARE_PROVIDER_SITE_OTHER): Payer: Self-pay

## 2017-05-12 VITALS — BP 122/72 | HR 94 | Ht 75.0 in | Wt 251.4 lb

## 2017-05-12 DIAGNOSIS — Z8489 Family history of other specified conditions: Secondary | ICD-10-CM | POA: Diagnosis not present

## 2017-05-12 DIAGNOSIS — R0602 Shortness of breath: Secondary | ICD-10-CM | POA: Diagnosis not present

## 2017-05-12 DIAGNOSIS — R9431 Abnormal electrocardiogram [ECG] [EKG]: Secondary | ICD-10-CM

## 2017-05-12 NOTE — Patient Instructions (Signed)
Medication Instructions:  Continue current medications  If you need a refill on your cardiac medications before your next appointment, please call your pharmacy.  Labwork: None Ordered  Testing/Procedures: Your physician has requested that you have a Coronary Calcium Score. This test is done at our [address] [facility]..  Follow-Up: Your physician wants you to follow-up in: As Needed.  .   Thank you for choosing [facility]!!

## 2017-05-13 ENCOUNTER — Encounter: Payer: Self-pay | Admitting: Cardiology

## 2017-05-19 ENCOUNTER — Ambulatory Visit (INDEPENDENT_AMBULATORY_CARE_PROVIDER_SITE_OTHER)
Admission: RE | Admit: 2017-05-19 | Discharge: 2017-05-19 | Disposition: A | Discharge status: Home / Self Care | Payer: Self-pay | Source: Ambulatory Visit | Attending: Cardiology | Admitting: Cardiology

## 2017-05-19 DIAGNOSIS — R0602 Shortness of breath: Secondary | ICD-10-CM

## 2017-05-26 ENCOUNTER — Telehealth: Payer: Self-pay | Admitting: *Deleted

## 2017-05-26 DIAGNOSIS — R931 Abnormal findings on diagnostic imaging of heart and coronary circulation: Secondary | ICD-10-CM

## 2017-05-26 NOTE — Telephone Encounter (Signed)
GXT ordered and send to scheduler to be schedule 

## 2017-05-26 NOTE — Telephone Encounter (Signed)
-----   Message from Rollene RotundaJames Hochrein, MD sent at 05/25/2017  3:13 PM EST ----- He has some coronary calcium.  I discussed this with him.  I would like to have him come back for a POET (Plain Old Exercise Treadmill).  Please call him and schedule this.  Send CT results to Rodrigo RanPerini, Mark, MD

## 2017-05-27 ENCOUNTER — Telehealth: Payer: Self-pay | Admitting: Cardiology

## 2017-05-27 NOTE — Telephone Encounter (Signed)
Called the patient and LVM to call and schedule GXT.

## 2017-05-28 ENCOUNTER — Encounter: Payer: Self-pay | Admitting: Cardiology

## 2017-06-10 ENCOUNTER — Telehealth (HOSPITAL_COMMUNITY): Payer: Self-pay

## 2017-06-10 NOTE — Telephone Encounter (Signed)
Encounter complete. 

## 2017-06-12 ENCOUNTER — Ambulatory Visit (HOSPITAL_COMMUNITY)
Admission: RE | Admit: 2017-06-12 | Discharge: 2017-06-12 | Disposition: A | Discharge status: Home / Self Care | Payer: BLUE CROSS/BLUE SHIELD | Source: Ambulatory Visit | Attending: Cardiology | Admitting: Cardiology

## 2017-06-12 DIAGNOSIS — R931 Abnormal findings on diagnostic imaging of heart and coronary circulation: Secondary | ICD-10-CM

## 2017-06-12 LAB — EXERCISE TOLERANCE TEST
Estimated workload: 16.7 METS
Exercise duration (min): 13 min
Exercise duration (sec): 53 s
MPHR: 183 {beats}/min
Peak HR: 181 {beats}/min
Percent HR: 98 %
RPE: 16
Rest HR: 62 {beats}/min

## 2017-12-14 ENCOUNTER — Other Ambulatory Visit: Payer: Self-pay | Admitting: Orthopedic Surgery

## 2017-12-14 DIAGNOSIS — M25562 Pain in left knee: Secondary | ICD-10-CM

## 2017-12-23 ENCOUNTER — Ambulatory Visit
Admission: RE | Admit: 2017-12-23 | Discharge: 2017-12-23 | Disposition: A | Discharge status: Home / Self Care | Payer: BLUE CROSS/BLUE SHIELD | Source: Ambulatory Visit | Attending: Orthopedic Surgery | Admitting: Orthopedic Surgery

## 2017-12-23 DIAGNOSIS — M25562 Pain in left knee: Secondary | ICD-10-CM

## 2018-04-30 ENCOUNTER — Encounter: Payer: Self-pay | Admitting: Cardiology

## 2018-05-19 NOTE — Progress Notes (Signed)
Cardiology Office Note   Date:  05/21/2018   ID:  Luis Sims, DOB 08-Aug-1980, MRN [id number]  PCP:  Rodrigo Ran, MD  Cardiologist:   No primary care provider on file. Referring:  Rodrigo Ran, MD  Chief Complaint  Patient presents with  . Coronary Calcium      History of Present Illness: Luis Sims is a 38 y.o. male who is referred by Rodrigo Ran, MD for evaluation of an abnormal EKG.  He had an echocardiogram in April 2018 to evaluate this and there was some mild focal basal hypertrophy of the septum.  The EF was 55-60%.  There was otherwise normal function.  He had a negative POET (Plain Old Exercise Treadmill) last year.  This was following a coronary calcium score that was 43 and 90th percentile for his age.    Since I last saw him he is done well.  He has some knee problems and is going to get some knee surgery so he has not been as aerobically active as I would like. The patient denies any new symptoms such as chest discomfort, neck or arm discomfort. There has been no new shortness of breath, PND or orthopnea. There have been no reported palpitations, presyncope or syncope.   Past Medical History:  Diagnosis Date  . Breast mass   . Hypercholesteremia     Past Surgical History:  Procedure Laterality Date  . ANKLE FRACTURE SURGERY    . ARTHROSCOPIC REPAIR ACL    . LAPAROSCOPIC APPENDECTOMY N/A 04/17/2017   Procedure: APPENDECTOMY LAPAROSCOPIC;  Surgeon: Manus Rudd, MD;  Location: [facility] OR;  Service: General;  Laterality: N/A;     Current Outpatient Medications  Medication Sig Dispense Refill  . ezetimibe (ZETIA) 10 MG tablet Take 10 mg by mouth daily.  1  . rosuvastatin (CRESTOR) 20 MG tablet Take 20 mg by mouth daily.     No current facility-administered medications for this visit.     Allergies:   Patient has no known allergies.    ROS:  Please see the history of present illness.   Otherwise, review of systems are positive for none.   All  other systems are reviewed and negative.    PHYSICAL EXAM: VS:  BP 124/72   Pulse (!) 52   Ht 6\' 3"  (1.905 m)   Wt 258 lb 9.6 oz (117.3 kg)   BMI 32.32 kg/m  , BMI Body mass index is 32.32 kg/m. GENERAL:  Well appearing NECK:  No jugular venous distention, waveform within normal limits, carotid upstroke brisk and symmetric, no bruits, no thyromegaly LUNGS:  Clear to auscultation bilaterally CHEST:  Unremarkable HEART:  PMI not displaced or sustained,S1 and S2 within normal limits, no S3, no S4, no clicks, no rubs, no murmurs ABD:  Flat, positive bowel sounds normal in frequency in pitch, no bruits, no rebound, no guarding, no midline pulsatile mass, no hepatomegaly, no splenomegaly EXT:  2 plus pulses throughout, no edema, no cyanosis no clubbing    EKG:  EKG is  ordered today. The ekg ordered today demonstrates sinus rhythm, rate 51, axis within normal limits, intervals within normal limits, early repolarization pattern.   Recent Labs: No results found for requested labs within last 8760 hours.    Lipid Panel No results found for: CHOL, TRIG, HDL, CHOLHDL, VLDL, LDLCALC, LDLDIRECT    Wt Readings from Last 3 Encounters:  05/21/18 258 lb 9.6 oz (117.3 kg)  05/12/17 251 lb 6.4 oz (114 kg)  04/17/17 247 lb (112 kg)      Other studies Reviewed: Additional studies/ records that were reviewed today include: Echo Review of the above records demonstrates:     ASSESSMENT AND PLAN:  FAMILY HISTORY OF SUDDEN DEATH:     He never could find an autopsy result on his father.  The thought was that his father died in his 69s suddenly of an acute coronary event.  However, there is some suggestion on the patient's echo of possible septal hypertrophy.  This will be evaluated as below.   DYSLIPIDEMIA:   His lipids in 2018 were excellent and he is due to get these repeated.  I agree with the meds as listed above.  SEPTAL HYPERTROPHY: I am going to send the patient for a repeat echo  today.  In her very low threshold to order an MRI.  He is moving to [place] and I will have to arrange probably to have that done there.  We can follow this up because of the abnormality noted but also because of family history with his father is somewhat vague.  CORONARY CALCIUM: He has had no new symptoms.  He had a negative POET (Plain Old Exercise Treadmill). Continue with aggressive risk reduction.  Current medicines are reviewed at length with the patient today.  The patient does not have concerns regarding medicines.  The following changes have been made:  None  Labs/ tests ordered today include:   Orders Placed This Encounter  Procedures  . EKG 12-Lead  . ECHOCARDIOGRAM COMPLETE     Disposition:   FU in [place].  I will find him a cardiologist.   Signed, Rollene Rotunda, MD  05/21/2018 8:22 AM    [facility]

## 2018-05-21 ENCOUNTER — Ambulatory Visit (HOSPITAL_COMMUNITY): Payer: BLUE CROSS/BLUE SHIELD | Attending: Cardiology

## 2018-05-21 ENCOUNTER — Ambulatory Visit (INDEPENDENT_AMBULATORY_CARE_PROVIDER_SITE_OTHER): Payer: BLUE CROSS/BLUE SHIELD | Admitting: Cardiology

## 2018-05-21 ENCOUNTER — Encounter: Payer: Self-pay | Admitting: Cardiology

## 2018-05-21 ENCOUNTER — Encounter (INDEPENDENT_AMBULATORY_CARE_PROVIDER_SITE_OTHER): Payer: Self-pay

## 2018-05-21 VITALS — BP 124/72 | HR 52 | Ht 75.0 in | Wt 258.6 lb

## 2018-05-21 DIAGNOSIS — R931 Abnormal findings on diagnostic imaging of heart and coronary circulation: Secondary | ICD-10-CM | POA: Insufficient documentation

## 2018-05-21 DIAGNOSIS — I422 Other hypertrophic cardiomyopathy: Secondary | ICD-10-CM | POA: Insufficient documentation

## 2018-05-21 DIAGNOSIS — E785 Hyperlipidemia, unspecified: Secondary | ICD-10-CM

## 2018-05-21 LAB — ECHOCARDIOGRAM COMPLETE
Height: 75 in
Weight: 4137.6 oz

## 2018-05-21 NOTE — Patient Instructions (Signed)
Medication Instructions:  Continue current medications  If you need a refill on your cardiac medications before your next appointment, please call your pharmacy.  Labwork: None Ordered   Testing/Procedures: Your physician has requested that you have an echocardiogram. Echocardiography is a painless test that uses sound waves to create images of your heart. It provides your doctor with information about the size and shape of your heart and how well your heart's chambers and valves are working. This procedure takes approximately one hour. There are no restrictions for this procedure.  Follow-Up: . Your physician recommends that you schedule a follow-up appointment in: As Needed   At [facility], you and your health needs are our priority.  As part of our continuing mission to provide you with exceptional heart care, we have created designated Provider Care Teams.  These Care Teams include your primary Cardiologist (physician) and Advanced Practice Providers (APPs -  Physician Assistants and Nurse Practitioners) who all work together to provide you with the care you need, when you need it.  Thank you for choosing [facility] at [facility]!!     

## 2018-05-25 ENCOUNTER — Telehealth: Payer: Self-pay | Admitting: *Deleted

## 2018-05-25 NOTE — Telephone Encounter (Signed)
-----   Message from Rollene Rotunda, MD sent at 05/23/2018  9:06 PM EST ----- No evidence of hypertrophic cardiomyopathy.  No further work up for this.  He is moving to [place].  He should try to get in with Dr. Cinda Quest who will be taking new patients.  Call Mr. Baris with the results and send results to Rodrigo Ran, MD

## 2018-05-25 NOTE — Telephone Encounter (Signed)
Left echo results and recommendations on cell voice mail. (ok per DPR). COPY SENT VIA epic to Dr Rodrigo Ran.

## 2018-05-31 IMAGING — CT CT HEART SCORING
2 series · 16 of 20 positions shown, 18 images · non-contrast
Comparison: CTA chest for PE 06/27/2005.

CLINICAL DATA: Risk stratification

EXAM:
Coronary Calcium Score
TECHNIQUE: The patient was scanned on a Siemens Somatom 64 slice scanner. Axial
non-contrast 3 mm slices were carried out through the heart. The
data set was analyzed on a dedicated work station and scored using
the Agatson method.

[Series 2: casc 3.0 i36f 2 bestdiast 73 % · axial · 0.39mm/px · z∈[-308,-182]mm · 8 of 55 slices shown, 10 images]
[im 7/55  vessel]
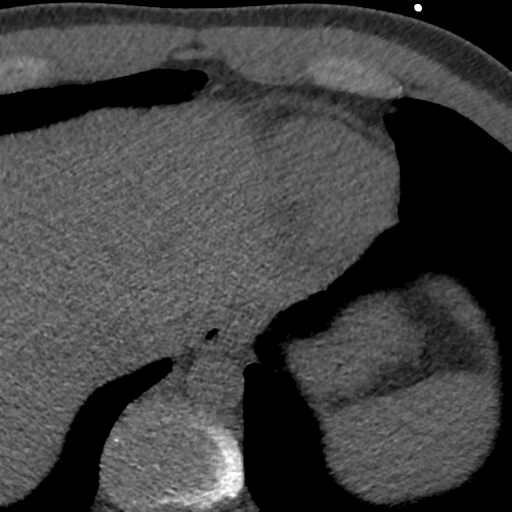
[im 7/55  lung]
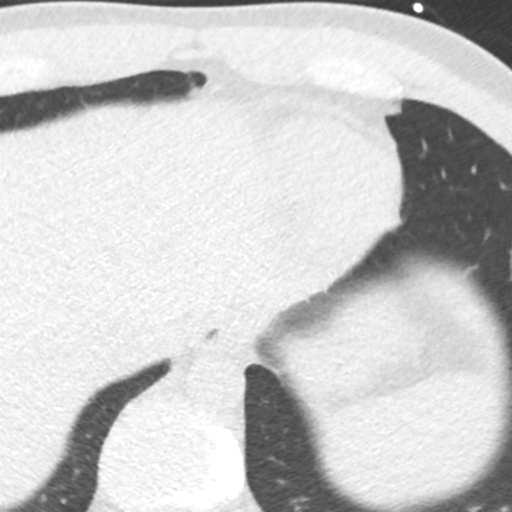
[im 13/55  vessel]
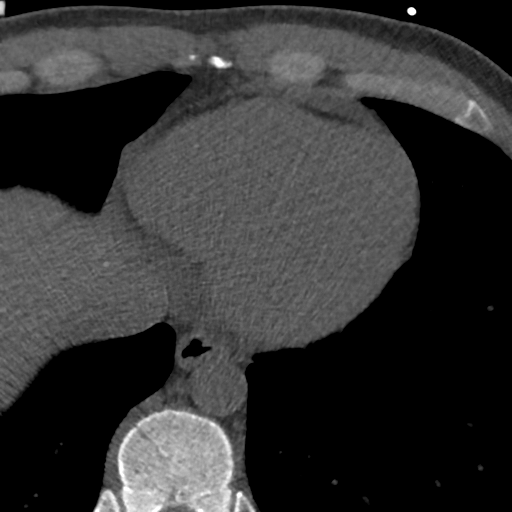
[im 19/55  vessel]
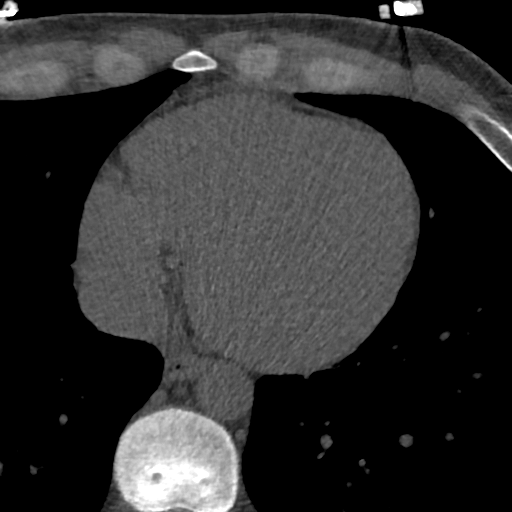
[im 25/55  vessel]
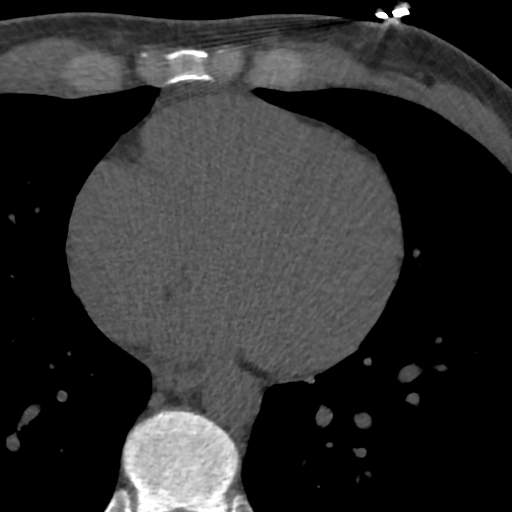
[im 31/55  vessel]
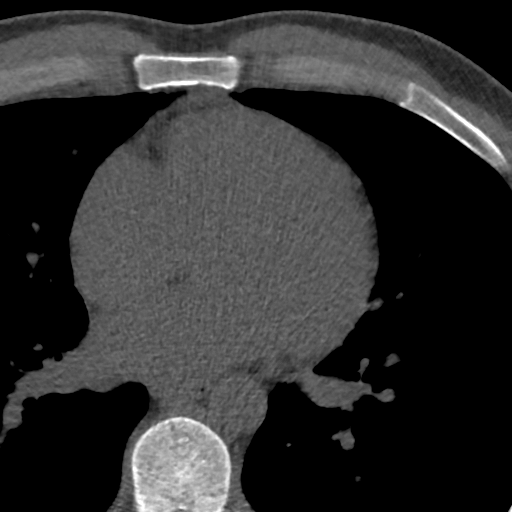
[im 31/55  lung]
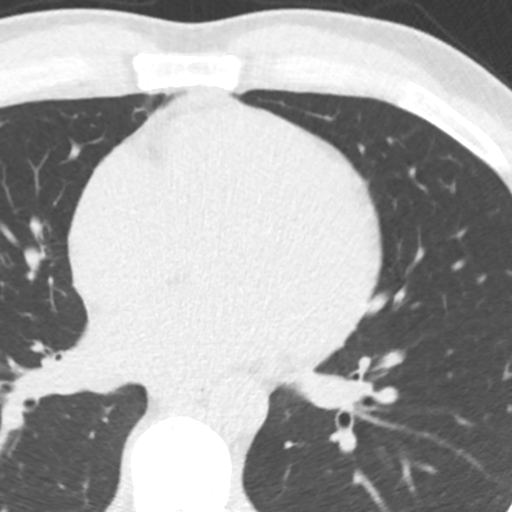
[im 37/55  vessel]
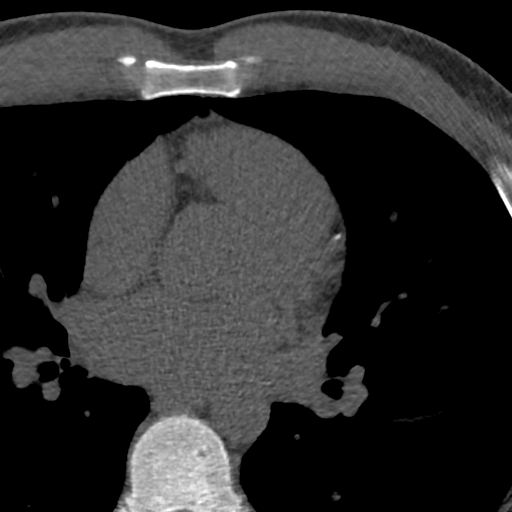
[im 43/55  vessel]
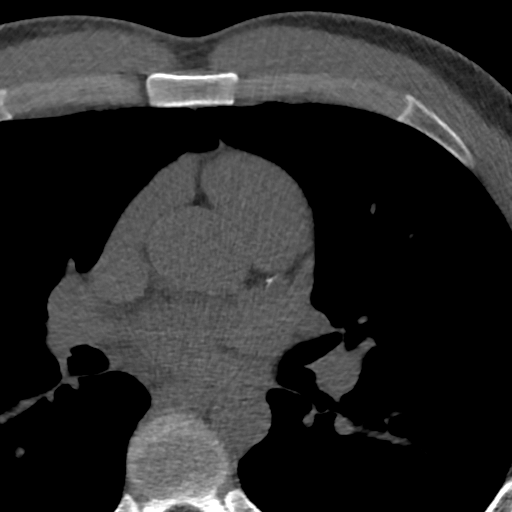
[im 49/55  vessel]
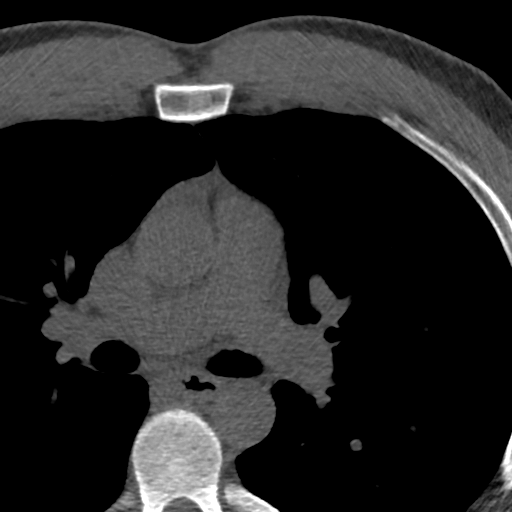

[Series 4: lung st 73 % · axial · 0.73mm/px · z∈[-308,-182]mm · 8 of 55 slices shown]
[im 7/55  lung]
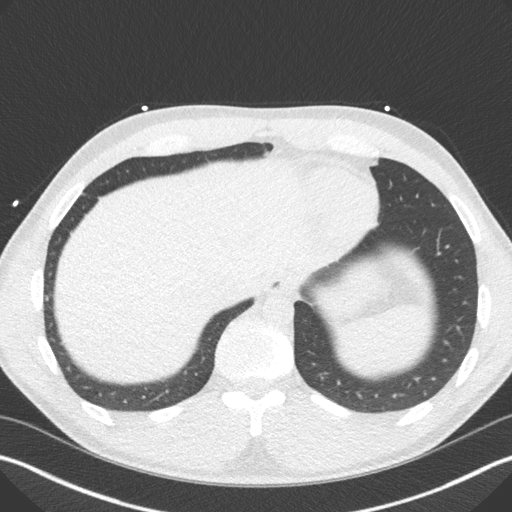
[im 13/55  lung]
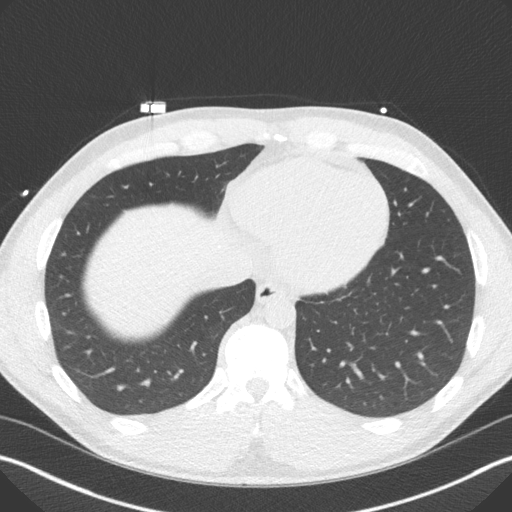
[im 19/55  lung]
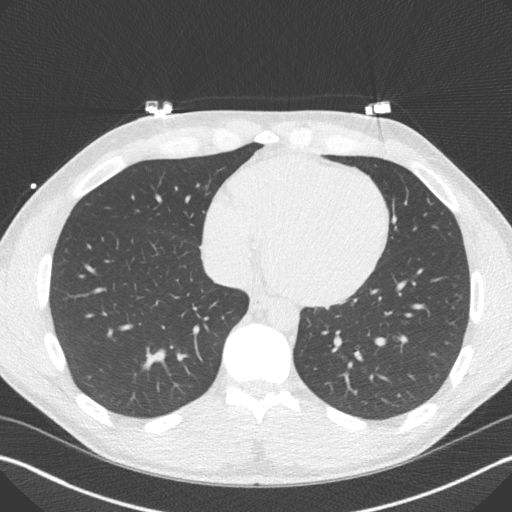
[im 25/55  lung]
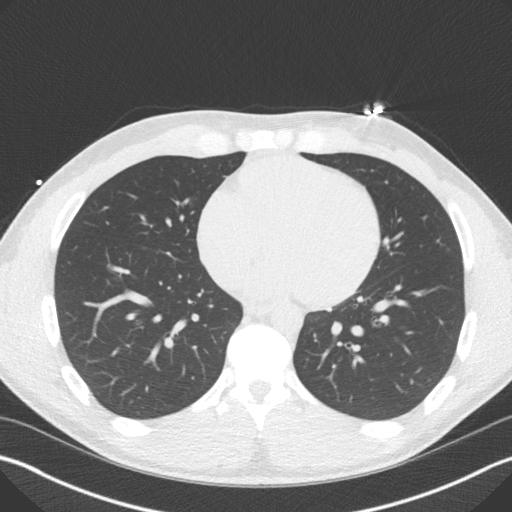
[im 31/55  lung]
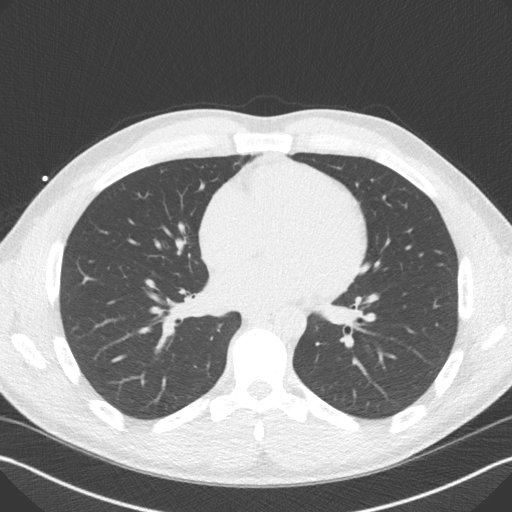
[im 37/55  lung]
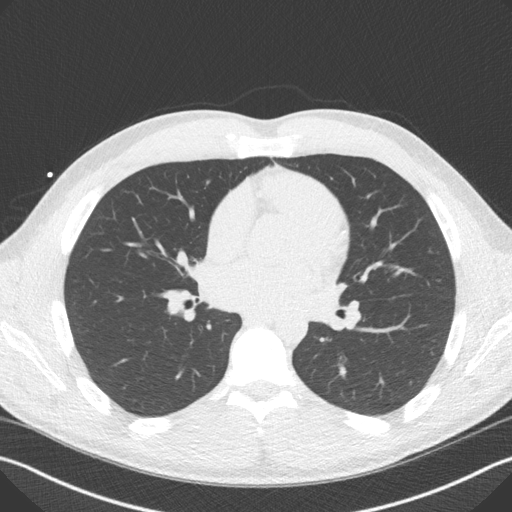
[im 43/55  lung]
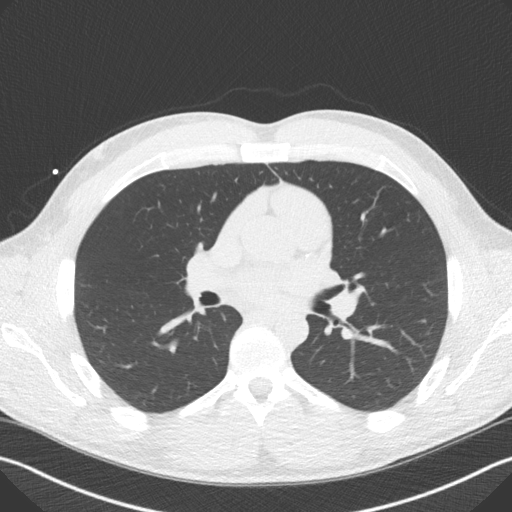
[im 49/55  lung]
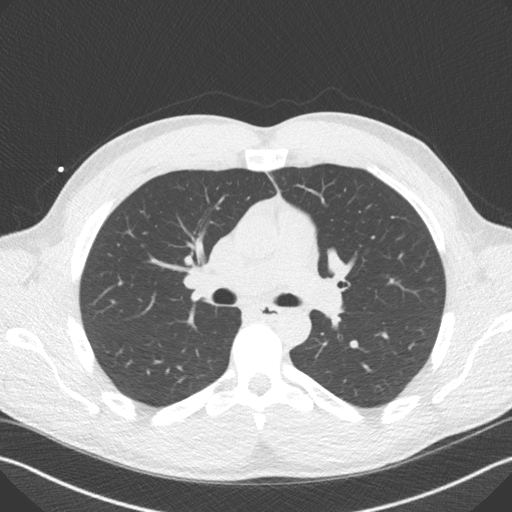

[16 of 20 positions shown; findings below may reference images not displayed]

FINDINGS: Non-cardiac: See separate report from [REDACTED].

Ascending Aorta: Normal diameter 3.3 cm

Pericardium: Normal

Coronary arteries: Calcium noted in proximal and mid LAD
IMPRESSION: Coronary calcium score of 49. The [employer] did not include
patients in this young age range but score is over the 90 th
percentile

Chikhawi Rhl

EXAM:
OVER-READ INTERPRETATION CT CHEST

The following report is an over-read performed by radiologist Dr.
over-read does not include interpretation of cardiac or coronary
anatomy or pathology. The coronary calcium score interpretation by
the cardiologist is attached.
FINDINGS: Vascular: Minimal calcified plaque involving the aortic arch. No
visible atherosclerosis elsewhere. No evidence of aortic aneurysm.

Mediastinum/Nodes: No pathologic lymphadenopathy within the
visualized mediastinum. Visualized esophagus normal in appearance.

Lungs/Pleura: Visualized lung parenchyma clear. Central bronchi
patent without significant bronchial wall thickening. No pleural
effusions.

Upper Abdomen: Visualized extreme upper abdomen unremarkable for the
low-dose unenhanced technique.

Musculoskeletal: Visualized bony thorax intact.
IMPRESSION: Minimal calcified plaque involving the aortic arch. No extracardiac
findings otherwise.
